# Patient Record
Sex: Female | Born: 1955
Health system: Southern US, Community
[De-identification: ages and names within clinical notes are randomized; demographics above are authoritative.]

## PROBLEM LIST (undated history)

## (undated) DIAGNOSIS — U071 COVID-19: Secondary | ICD-10-CM

## (undated) DIAGNOSIS — D649 Anemia, unspecified: Secondary | ICD-10-CM

## (undated) DIAGNOSIS — F32A Depression, unspecified: Secondary | ICD-10-CM

## (undated) DIAGNOSIS — F419 Anxiety disorder, unspecified: Secondary | ICD-10-CM

## (undated) DIAGNOSIS — K589 Irritable bowel syndrome without diarrhea: Secondary | ICD-10-CM

## (undated) DIAGNOSIS — K219 Gastro-esophageal reflux disease without esophagitis: Secondary | ICD-10-CM

## (undated) DIAGNOSIS — J449 Chronic obstructive pulmonary disease, unspecified: Secondary | ICD-10-CM

## (undated) DIAGNOSIS — F329 Major depressive disorder, single episode, unspecified: Secondary | ICD-10-CM

## (undated) DIAGNOSIS — J8410 Pulmonary fibrosis, unspecified: Secondary | ICD-10-CM

## (undated) DIAGNOSIS — J849 Interstitial pulmonary disease, unspecified: Secondary | ICD-10-CM

## (undated) HISTORY — DX: Gastro-esophageal reflux disease without esophagitis: K21.9

## (undated) HISTORY — PX: ABDOMINAL HYSTERECTOMY: SHX81

## (undated) HISTORY — DX: Depression, unspecified: F32.A

## (undated) HISTORY — DX: Anxiety disorder, unspecified: F41.9

## (undated) HISTORY — DX: Major depressive disorder, single episode, unspecified: F32.9

## (undated) HISTORY — DX: Interstitial pulmonary disease, unspecified: J84.9

## (undated) HISTORY — DX: Anemia, unspecified: D64.9

## (undated) HISTORY — DX: Irritable bowel syndrome, unspecified: K58.9

---

## 1979-05-29 HISTORY — PX: TUBAL LIGATION: SHX77

## 1981-05-28 HISTORY — PX: PARTIAL HYSTERECTOMY: SHX80

## 2005-06-29 ENCOUNTER — Emergency Department (HOSPITAL_COMMUNITY): Admission: EM | Admit: 2005-06-29 | Discharge: 2005-06-29 | Payer: Self-pay | Admitting: Emergency Medicine

## 2005-10-01 ENCOUNTER — Ambulatory Visit (HOSPITAL_BASED_OUTPATIENT_CLINIC_OR_DEPARTMENT_OTHER): Admission: RE | Admit: 2005-10-01 | Discharge: 2005-10-01 | Payer: Self-pay | Admitting: Orthopedic Surgery

## 2011-03-21 DIAGNOSIS — R079 Chest pain, unspecified: Secondary | ICD-10-CM

## 2016-01-10 ENCOUNTER — Other Ambulatory Visit: Payer: Self-pay | Admitting: Physician Assistant

## 2016-01-10 ENCOUNTER — Ambulatory Visit: Payer: Self-pay | Admitting: Physician Assistant

## 2016-01-10 ENCOUNTER — Encounter: Payer: Self-pay | Admitting: Physician Assistant

## 2016-01-10 VITALS — BP 104/60 | HR 99 | Temp 97.7°F | Ht 64.25 in | Wt 175.8 lb

## 2016-01-10 DIAGNOSIS — Z131 Encounter for screening for diabetes mellitus: Secondary | ICD-10-CM

## 2016-01-10 DIAGNOSIS — Z13220 Encounter for screening for lipoid disorders: Secondary | ICD-10-CM

## 2016-01-10 DIAGNOSIS — F419 Anxiety disorder, unspecified: Secondary | ICD-10-CM

## 2016-01-10 DIAGNOSIS — Z1239 Encounter for other screening for malignant neoplasm of breast: Secondary | ICD-10-CM

## 2016-01-10 DIAGNOSIS — W57XXXA Bitten or stung by nonvenomous insect and other nonvenomous arthropods, initial encounter: Secondary | ICD-10-CM

## 2016-01-10 LAB — GLUCOSE, POCT (MANUAL RESULT ENTRY): POC Glucose: 115 mg/dl — AB (ref 70–99)

## 2016-01-10 MED ORDER — SERTRALINE HCL 50 MG PO TABS
50.0000 mg | ORAL_TABLET | Freq: Every day | ORAL | 0 refills | Status: DC
Start: 2016-01-10 — End: 2016-01-31

## 2016-01-10 MED ORDER — ALBUTEROL SULFATE HFA 108 (90 BASE) MCG/ACT IN AERS: 2.0000 | INHALATION_SPRAY | Freq: Four times a day (QID) | RESPIRATORY_TRACT | 1 refills | Status: DC | PRN

## 2016-01-10 NOTE — Progress Notes (Signed)
BP 104/60 (BP Location: Right Arm, Patient Position: Sitting, Cuff Size: Normal)   Pulse 99   Temp 97.7 F (36.5 C)   Ht 5' 4.25" (1.632 m)   Wt 175 lb 12 oz (79.7 kg)   SpO2 90%   BMI 29.93 kg/m    Subjective:    Patient ID: Amber Lloyd, female    DOB: November 13, 1955, 60 y.o.   MRN: [id number]  HPI: Amber Lloyd is a 60 y.o. female presenting on 01/10/2016 for New Patient (Initial Visit); Skin Problem (pt states it is everywhere. pt states itch, burn when scratches. pt states she has been using antibiotic cream and helps a little. pt believes it may be "derma mites",); and Mental Health Problem (pt states she has been having anxiety attacks and sob. not necessarily at the same time.)   HPI Chief Complaint  Patient presents with  . New Patient (Initial Visit)  . Skin Problem    pt states it is everywhere. pt states itch, burn when scratches. pt states she has been using antibiotic cream and helps a little. pt believes it may be "derma mites",  . Mental Health Problem    pt states she has been having anxiety attacks and sob. not necessarily at the same time.   Pt last had PCP in [place] with Dr Loney Hering about 3 years ago.  Pt c/o R sided HA for the past month. It is not constant. usually 3 days / week. It lasts 24-48 hours.  She takes otc cold and sinus medicine and ibuprofen and they help but they don't resolve problem.    Pt has hx anxiety attacks.  She was on zoloft in the past and it worked well for her.    Pt states rash off and on for 6 years. She says it's more like bites. She says there are 5 people in her home and she is the only one with these bites. She shares bed with husband and he doesn't have them.  Her dog also sleeps with her.   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 Interim medical history since our last visit reviewed. Allergies and medications reviewed and updated.  Review of Systems  Constitutional: Negative for appetite change, chills, diaphoresis, fatigue, fever and unexpected weight change.  HENT: Positive for congestion. Negative for dental problem, drooling, ear pain, facial swelling, hearing loss, mouth sores, sneezing, sore throat, trouble swallowing and voice change.   Eyes: Negative for pain, discharge, redness, itching and visual disturbance.  Respiratory: Positive for cough and shortness of breath. Negative for choking and wheezing.   Cardiovascular: Negative for chest pain, palpitations and leg swelling.  Gastrointestinal: Positive for constipation. Negative for abdominal pain, blood in stool, diarrhea and vomiting.  Endocrine: Positive for cold intolerance, heat intolerance and polydipsia.  Genitourinary: Negative for decreased urine volume, dysuria and hematuria.  Musculoskeletal: Positive for arthralgias and back pain. Negative for gait problem.  Skin: Negative for rash.  Allergic/Immunologic: Positive for environmental allergies.  Neurological: Positive for headaches. Negative for seizures, syncope and light-headedness.  Hematological: Negative for adenopathy.  Psychiatric/Behavioral: Positive for agitation and dysphoric mood. Negative for suicidal ideas. The patient is nervous/anxious.     Per HPI unless specifically indicated above     Objective:    BP 104/60 (BP Location:  Right Arm, Patient Position: Sitting, Cuff Size: Normal)   Pulse 99   Temp 97.7 F (36.5 C)   Ht 5' 4.25" (1.632 m)   Wt 175 lb 12 oz (79.7 kg)   SpO2 90%   BMI 29.93 kg/m   Wt Readings from Last 3 Encounters:  01/10/16 175 lb 12 oz (79.7 kg)    GAD 7 score 19 PHQ 9 score 12  Physical Exam  Constitutional: She is oriented to person, place, and time. She appears well-developed and well-nourished.  HENT:  Head: Normocephalic and  atraumatic.  Mouth/Throat: Oropharynx is clear and moist. No oropharyngeal exudate.  Eyes: Conjunctivae and EOM are normal. Pupils are equal, round, and reactive to light.  Neck: Neck supple. No thyromegaly present.  Cardiovascular: Normal rate and regular rhythm.   Pulmonary/Chest: Effort normal and breath sounds normal.  Abdominal: Soft. Bowel sounds are normal. She exhibits no mass. There is no hepatosplenomegaly. There is no tenderness.  Musculoskeletal: She exhibits no edema.  Lymphadenopathy:    She has no cervical adenopathy.  Neurological: She is alert and oriented to person, place, and time. Gait normal.  Skin: Skin is warm and dry.  Multiple papules scattered on body consistent with bites.  Most with excoriations. No seconary infection seen  Psychiatric: She has a normal mood and affect. Her behavior is normal.  Vitals reviewed.   Results for orders placed or performed in visit on 01/10/16  POCT Glucose (CBG)  Result Value Ref Range   POC Glucose 115 (A) 70 - 99 mg/dl      Assessment & Plan:   Encounter Diagnoses  Name Primary?  Marland Kitchen Anxiety Yes  . Insect bite of multiple sites with local reaction   . Screening for diabetes mellitus (DM)   . Screening cholesterol level   . Screening for breast cancer     -pt to get Baseline labs -rx zoloft for anxiety -order albuterol mdi -sign pt up for [facility] to get inhaler.  When zoloft gets to correct dosage, can order this through [facility]ist as well -encouraged pt to contact [facility] for anxiety counseling -order screening mammogram -recommended otc hydrocortisone cream for itching/bites. Suspect anxiety and chronic scratching making issue much worse -f/u in 3 weeks.  RTO sooner prn

## 2016-01-10 NOTE — Patient Instructions (Signed)
Baseline labs zoloft Inhaler Call [facility] for counseling appointment We will schedule mammogram for you otc hydrocortisone cream for itchy spots

## 2016-01-17 LAB — CBC WITH DIFFERENTIAL/PLATELET
BASOS PCT: 0 %
Basophils Absolute: 0 cells/uL (ref 0–200)
EOS PCT: 9 %
Eosinophils Absolute: 414 cells/uL (ref 15–500)
HCT: 28.1 % — ABNORMAL LOW (ref 35.0–45.0)
Hemoglobin: 8.6 g/dL — ABNORMAL LOW (ref 11.7–15.5)
LYMPHS PCT: 25 %
Lymphs Abs: 1150 cells/uL (ref 850–3900)
MCH: 22.9 pg — ABNORMAL LOW (ref 27.0–33.0)
MCHC: 30.6 g/dL — ABNORMAL LOW (ref 32.0–36.0)
MCV: 74.9 fL — ABNORMAL LOW (ref 80.0–100.0)
MONO ABS: 414 {cells}/uL (ref 200–950)
MONOS PCT: 9 %
MPV: 9.4 fL (ref 7.5–12.5)
Neutro Abs: 2622 cells/uL (ref 1500–7800)
Neutrophils Relative %: 57 %
PLATELETS: 231 10*3/uL (ref 140–400)
RBC: 3.75 MIL/uL — AB (ref 3.80–5.10)
RDW: 14.8 % (ref 11.0–15.0)
WBC: 4.6 10*3/uL (ref 3.8–10.8)

## 2016-01-17 LAB — TSH: TSH: 1.86 m[IU]/L

## 2016-01-17 LAB — COMPREHENSIVE METABOLIC PANEL
ALK PHOS: 70 U/L (ref 33–130)
ALT: 9 U/L (ref 6–29)
AST: 13 U/L (ref 10–35)
Albumin: 3.8 g/dL (ref 3.6–5.1)
BUN: 12 mg/dL (ref 7–25)
CHLORIDE: 106 mmol/L (ref 98–110)
CO2: 25 mmol/L (ref 20–31)
CREATININE: 0.7 mg/dL (ref 0.50–0.99)
Calcium: 8.6 mg/dL (ref 8.6–10.4)
Glucose, Bld: 95 mg/dL (ref 65–99)
Potassium: 3.9 mmol/L (ref 3.5–5.3)
SODIUM: 140 mmol/L (ref 135–146)
TOTAL PROTEIN: 6.5 g/dL (ref 6.1–8.1)
Total Bilirubin: 0.6 mg/dL (ref 0.2–1.2)

## 2016-01-17 LAB — LIPID PANEL
Cholesterol: 169 mg/dL (ref 125–200)
HDL: 68 mg/dL (ref >=46)
LDL CALC: 87 mg/dL (ref <130)
Total CHOL/HDL Ratio: 2.5 Ratio (ref <=5.0)
Triglycerides: 70 mg/dL (ref <150)
VLDL: 14 mg/dL (ref <30)

## 2016-01-18 LAB — HEMOGLOBIN A1C
HEMOGLOBIN A1C: 5.4 % (ref <5.7)
Mean Plasma Glucose: 108 mg/dL

## 2016-01-26 ENCOUNTER — Other Ambulatory Visit: Payer: Self-pay | Admitting: Physician Assistant

## 2016-01-26 ENCOUNTER — Encounter (HOSPITAL_COMMUNITY): Payer: Self-pay

## 2016-01-26 ENCOUNTER — Ambulatory Visit (HOSPITAL_COMMUNITY)
Admission: RE | Admit: 2016-01-26 | Discharge: 2016-01-26 | Disposition: A | Discharge status: Home / Self Care | Payer: Self-pay | Source: Ambulatory Visit | Attending: Physician Assistant | Admitting: Physician Assistant

## 2016-01-26 ENCOUNTER — Ambulatory Visit (HOSPITAL_COMMUNITY): Payer: Self-pay

## 2016-01-26 ENCOUNTER — Ambulatory Visit (HOSPITAL_COMMUNITY): Admission: RE | Admit: 2016-01-26 | Payer: Self-pay | Source: Ambulatory Visit

## 2016-01-26 DIAGNOSIS — Z1231 Encounter for screening mammogram for malignant neoplasm of breast: Secondary | ICD-10-CM

## 2016-01-27 ENCOUNTER — Other Ambulatory Visit: Payer: Self-pay | Admitting: Physician Assistant

## 2016-01-27 DIAGNOSIS — R928 Other abnormal and inconclusive findings on diagnostic imaging of breast: Secondary | ICD-10-CM

## 2016-01-31 ENCOUNTER — Encounter: Payer: Self-pay | Admitting: Physician Assistant

## 2016-01-31 ENCOUNTER — Ambulatory Visit: Payer: Self-pay | Admitting: Physician Assistant

## 2016-01-31 ENCOUNTER — Other Ambulatory Visit: Payer: Self-pay | Admitting: Physician Assistant

## 2016-01-31 VITALS — BP 100/60 | HR 86 | Temp 97.5°F | Resp 16 | Ht 64.25 in | Wt 175.0 lb

## 2016-01-31 DIAGNOSIS — F419 Anxiety disorder, unspecified: Secondary | ICD-10-CM

## 2016-01-31 DIAGNOSIS — Z1211 Encounter for screening for malignant neoplasm of colon: Secondary | ICD-10-CM

## 2016-01-31 DIAGNOSIS — D649 Anemia, unspecified: Secondary | ICD-10-CM

## 2016-01-31 MED ORDER — SERTRALINE HCL 50 MG PO TABS: 50.0000 mg | ORAL_TABLET | Freq: Every day | ORAL | 3 refills | Status: DC

## 2016-01-31 NOTE — Patient Instructions (Signed)
Return stool test Start iron daily Continue zoloft Recheck blood in one month

## 2016-01-31 NOTE — Progress Notes (Signed)
 BP 100/60   Pulse 86   Temp 97.5 F (36.4 C)   Resp 16   Ht 5' 4.25" (1.632 m)   Wt 175 lb (79.4 kg)   SpO2 97%   BMI 29.81 kg/m    Subjective:    Patient ID: Amber Lloyd, female    DOB: 04/21/1956, 60 y.o.   MRN: [id number]  HPI: Amber Lloyd is a 60 y.o. female presenting on 01/31/2016 for Mental Health Problem and Follow-up (labs)   HPI   Pt did not call [facility]. she says she didn't feel it was necessary.  She says the zoloft is helping a lot.    Reviewed labs with pt.  She states normal colonoscopy when she was 49 at [facility].  States hx anemia.   Relevant past medical, surgical, family and social history reviewed and updated as indicated. Interim medical history since our last visit reviewed. Allergies and medications reviewed and updated.   Current Outpatient Prescriptions:  .  Ibuprofen (ADVIL PO), Take 1 tablet by mouth daily. For shoulder pain and headaches, Disp: , Rfl:  .  loratadine (CLARITIN) 10 MG tablet, Take 10 mg by mouth daily., Disp: , Rfl:  .  naproxen sodium (ANAPROX) 220 MG tablet, Take 220 mg by mouth daily as needed., Disp: , Rfl:  .  OMEPRAZOLE PO, Take 1 tablet by mouth daily., Disp: , Rfl:  .  sertraline (ZOLOFT) 50 MG tablet, Take 1 tablet (50 mg total) by mouth daily., Disp: 30 tablet, Rfl: 0 .  albuterol (PROVENTIL HFA;VENTOLIN HFA) 108 (90 Base) MCG/ACT inhaler, Inhale 2 puffs into the lungs every 6 (six) hours as needed for wheezing or shortness of breath. (Patient not taking: Reported on 01/31/2016), Disp: 3 Inhaler, Rfl: 1   Review of Systems  Constitutional: Negative for appetite change, chills, diaphoresis, fatigue, fever and unexpected weight change.  HENT: Positive for congestion. Negative for drooling, ear pain, facial swelling, hearing loss, mouth sores, sneezing, sore throat, trouble swallowing and voice change.   Eyes: Negative for pain, discharge, redness, itching and visual disturbance.  Respiratory: Positive for shortness of breath.  Negative for cough, choking and wheezing.   Cardiovascular: Negative for chest pain, palpitations and leg swelling.  Gastrointestinal: Negative for abdominal pain, blood in stool, constipation, diarrhea and vomiting.  Endocrine: Negative for cold intolerance, heat intolerance and polydipsia.  Genitourinary: Negative for decreased urine volume, dysuria and hematuria.  Musculoskeletal: Negative for arthralgias, back pain and gait problem.  Skin: Negative for rash.  Allergic/Immunologic: Positive for environmental allergies.  Neurological: Positive for headaches. Negative for seizures, syncope and light-headedness.  Hematological: Negative for adenopathy.  Psychiatric/Behavioral: Positive for agitation. Negative for dysphoric mood and suicidal ideas. The patient is nervous/anxious.     Per HPI unless specifically indicated above     Objective:    BP 100/60   Pulse 86   Temp 97.5 F (36.4 C)   Resp 16   Ht 5' 4.25" (1.632 m)   Wt 175 lb (79.4 kg)   SpO2 97%   BMI 29.81 kg/m   Wt Readings from Last 3 Encounters:  01/31/16 175 lb (79.4 kg)  01/10/16 175 lb 12 oz (79.7 kg)    Physical Exam  Constitutional: She is oriented to person, place, and time. She appears well-developed and well-nourished.  HENT:  Head: Normocephalic and atraumatic.  Neck: Neck supple.  Cardiovascular: Normal rate and regular rhythm.   Pulmonary/Chest: Effort normal and breath sounds normal.  Abdominal: Soft. Bowel sounds are normal. She   exhibits no mass. There is no hepatosplenomegaly. There is no tenderness.  Musculoskeletal: She exhibits no edema.  Lymphadenopathy:    She has no cervical adenopathy.  Neurological: She is alert and oriented to person, place, and time.  Skin: Skin is warm and dry.  Skin that was previously severely picked at last appointment is much improved- no obvious excoriations seen  Psychiatric: She has a normal mood and affect. Her behavior is normal.  Vitals reviewed.      Results for orders placed or performed in visit on 01/10/16  TSH  Result Value Ref Range   TSH 1.86 mIU/L  CBC w/Diff/Platelet  Result Value Ref Range   WBC 4.6 3.8 - 10.8 K/uL   RBC 3.75 (L) 3.80 - 5.10 MIL/uL   Hemoglobin 8.6 (L) 11.7 - 15.5 g/dL   HCT 28.1 (L) 35.0 - 45.0 %   MCV 74.9 (L) 80.0 - 100.0 fL   MCH 22.9 (L) 27.0 - 33.0 pg   MCHC 30.6 (L) 32.0 - 36.0 g/dL   RDW 14.8 11.0 - 15.0 %   Platelets 231 140 - 400 K/uL   MPV 9.4 7.5 - 12.5 fL   Neutro Abs 2,622 1,500 - 7,800 cells/uL   Lymphs Abs 1,150 850 - 3,900 cells/uL   Monocytes Absolute 414 200 - 950 cells/uL   Eosinophils Absolute 414 15 - 500 cells/uL   Basophils Absolute 0 0 - 200 cells/uL   Neutrophils Relative % 57 %   Lymphocytes Relative 25 %   Monocytes Relative 9 %   Eosinophils Relative 9 %   Basophils Relative 0 %   Smear Review Criteria for review not met   Comprehensive metabolic panel  Result Value Ref Range   Sodium 140 135 - 146 mmol/L   Potassium 3.9 3.5 - 5.3 mmol/L   Chloride 106 98 - 110 mmol/L   CO2 25 20 - 31 mmol/L   Glucose, Bld 95 65 - 99 mg/dL   BUN 12 7 - 25 mg/dL   Creat 0.70 0.50 - 0.99 mg/dL   Total Bilirubin 0.6 0.2 - 1.2 mg/dL   Alkaline Phosphatase 70 33 - 130 U/L   AST 13 10 - 35 U/L   ALT 9 6 - 29 U/L   Total Protein 6.5 6.1 - 8.1 g/dL   Albumin 3.8 3.6 - 5.1 g/dL   Calcium 8.6 8.6 - 10.4 mg/dL  Hemoglobin A1C  Result Value Ref Range   Hgb A1c MFr Bld 5.4 <5.7 %   Mean Plasma Glucose 108 mg/dL  Lipid Profile  Result Value Ref Range   Cholesterol 169 125 - 200 mg/dL   Triglycerides 70 <150 mg/dL   HDL 68 >=46 mg/dL   Total CHOL/HDL Ratio 2.5 <=5.0 Ratio   VLDL 14 <30 mg/dL   LDL Cholesterol 87 <130 mg/dL  POCT Glucose (CBG)  Result Value Ref Range   POC Glucose 115 (A) 70 - 99 mg/dl      Assessment & Plan:   Encounter Diagnoses  Name Primary?  Marland Kitchen Anemia, unspecified anemia type Yes  . Anxiety   . Special screening for malignant neoplasms, colon       -iFOBT given for colon cancer screening -pt to start otc iron for anemia -pt to Continue zoloft for mood -discussed needed f/u for mammogram -Recheck 1 month

## 2016-02-06 LAB — IFOBT (OCCULT BLOOD): IFOBT: NEGATIVE

## 2016-02-14 ENCOUNTER — Other Ambulatory Visit (HOSPITAL_COMMUNITY): Payer: Self-pay | Admitting: *Deleted

## 2016-02-14 DIAGNOSIS — R928 Other abnormal and inconclusive findings on diagnostic imaging of breast: Secondary | ICD-10-CM

## 2016-02-20 ENCOUNTER — Other Ambulatory Visit: Payer: Self-pay

## 2016-02-20 DIAGNOSIS — D649 Anemia, unspecified: Secondary | ICD-10-CM

## 2016-02-21 ENCOUNTER — Ambulatory Visit (HOSPITAL_COMMUNITY)
Admission: RE | Admit: 2016-02-21 | Discharge: 2016-02-21 | Disposition: A | Discharge status: Home / Self Care | Payer: PRIVATE HEALTH INSURANCE | Source: Ambulatory Visit | Attending: *Deleted | Admitting: *Deleted

## 2016-02-21 DIAGNOSIS — N63 Unspecified lump in breast: Secondary | ICD-10-CM | POA: Insufficient documentation

## 2016-02-21 DIAGNOSIS — R928 Other abnormal and inconclusive findings on diagnostic imaging of breast: Secondary | ICD-10-CM | POA: Diagnosis present

## 2016-02-21 LAB — HEMOGLOBIN: Hemoglobin: 10.1 g/dL — ABNORMAL LOW (ref 11.7–15.5)

## 2016-02-21 LAB — HEMATOCRIT: HEMATOCRIT: 33.5 % — AB (ref 35.0–45.0)

## 2016-02-27 ENCOUNTER — Ambulatory Visit: Payer: Self-pay

## 2016-02-28 ENCOUNTER — Encounter: Payer: Self-pay | Admitting: Physician Assistant

## 2016-02-28 ENCOUNTER — Ambulatory Visit: Payer: Self-pay | Admitting: Physician Assistant

## 2016-02-28 VITALS — BP 104/62 | HR 84 | Temp 97.9°F | Ht 64.25 in | Wt 171.2 lb

## 2016-02-28 DIAGNOSIS — J449 Chronic obstructive pulmonary disease, unspecified: Secondary | ICD-10-CM

## 2016-02-28 DIAGNOSIS — F419 Anxiety disorder, unspecified: Secondary | ICD-10-CM

## 2016-02-28 DIAGNOSIS — D649 Anemia, unspecified: Secondary | ICD-10-CM

## 2016-02-28 DIAGNOSIS — J439 Emphysema, unspecified: Secondary | ICD-10-CM | POA: Insufficient documentation

## 2016-02-28 NOTE — Progress Notes (Signed)
BP 104/62 (BP Location: Left Arm, Patient Position: Sitting, Cuff Size: Normal)   Pulse 84   Temp 97.9 F (36.6 C)   Ht 5' 4.25" (1.632 m)   Wt 171 lb 3.2 oz (77.7 kg)   SpO2 93%   BMI 29.16 kg/m    Subjective:    Patient ID: Amber Lloyd, female    DOB: [birth year], 59 y.o.   MRN: [id number]  HPI: Amber Lloyd is a 60 y.o. female presenting on 02/28/2016 for Anemia and Anxiety   HPI   Pt states she is feeling much better on the zoloft  Pt didn't get inhaler because she didn't bring in papers needed for [facility]  Relevant past medical, surgical, family and social history reviewed and updated as indicated. Interim medical history since our last visit reviewed. Allergies and medications reviewed and updated.   Current Outpatient Prescriptions:  .  Ibuprofen (ADVIL PO), Take 1 tablet by mouth daily as needed. For shoulder pain and headaches , Disp: , Rfl:  .  IRON PO, Take 1 tablet by mouth daily., Disp: , Rfl:  .  loratadine (CLARITIN) 10 MG tablet, Take 10 mg by mouth daily as needed. , Disp: , Rfl:  .  naproxen sodium (ANAPROX) 220 MG tablet, Take 220 mg by mouth daily as needed., Disp: , Rfl:  .  OMEPRAZOLE PO, Take 1 tablet by mouth daily., Disp: , Rfl:  .  sertraline (ZOLOFT) 50 MG tablet, Take 1 tablet (50 mg total) by mouth daily., Disp: 30 tablet, Rfl: 3 .  albuterol (PROVENTIL HFA;VENTOLIN HFA) 108 (90 Base) MCG/ACT inhaler, Inhale 2 puffs into the lungs every 6 (six) hours as needed for wheezing or shortness of breath. (Patient not taking: Reported on 02/28/2016), Disp: 3 Inhaler, Rfl: 1   Review of Systems  Constitutional: Positive for fatigue. Negative for appetite change, chills, diaphoresis, fever and unexpected weight change.  HENT: Negative for congestion, dental problem, drooling, ear pain, facial swelling, hearing loss, mouth sores, sneezing, sore throat, trouble swallowing and voice change.   Eyes: Negative for pain, discharge, redness, itching and  visual disturbance.  Respiratory: Positive for shortness of breath. Negative for cough, choking and wheezing.   Cardiovascular: Negative for chest pain, palpitations and leg swelling.  Gastrointestinal: Negative for abdominal pain, blood in stool, constipation, diarrhea and vomiting.  Endocrine: Negative for cold intolerance, heat intolerance and polydipsia.  Genitourinary: Negative for decreased urine volume, dysuria and hematuria.  Musculoskeletal: Positive for arthralgias and back pain. Negative for gait problem.  Skin: Negative for rash.  Allergic/Immunologic: Negative for environmental allergies.  Neurological: Negative for seizures, syncope, light-headedness and headaches.  Hematological: Negative for adenopathy.  Psychiatric/Behavioral: Negative for agitation, dysphoric mood and suicidal ideas. The patient is nervous/anxious.     Per HPI unless specifically indicated above     Objective:    BP 104/62 (BP Location: Left Arm, Patient Position: Sitting, Cuff Size: Normal)   Pulse 84   Temp 97.9 F (36.6 C)   Ht 5' 4.25" (1.632 m)   Wt 171 lb 3.2 oz (77.7 kg)   SpO2 93%   BMI 29.16 kg/m   Wt Readings from Last 3 Encounters:  02/28/16 171 lb 3.2 oz (77.7 kg)  01/31/16 175 lb (79.4 kg)  01/10/16 175 lb 12 oz (79.7 kg)    Physical Exam  Constitutional: She is oriented to person, place, and time. She appears well-developed and well-nourished.  HENT:  Head: Normocephalic and atraumatic.  Neck: Neck supple.  Cardiovascular: Normal rate  and regular rhythm.   Pulmonary/Chest: Effort normal. No tachypnea. No respiratory distress. She has wheezes (occassional soft expiratory wheeze). She has no rales.  Abdominal: Soft. Bowel sounds are normal. She exhibits no mass. There is no hepatosplenomegaly. There is no tenderness.  Musculoskeletal: She exhibits no edema.  Lymphadenopathy:    She has no cervical adenopathy.  Neurological: She is alert and oriented to person, place, and time.   Skin: Skin is warm and dry.  Psychiatric: She has a normal mood and affect. Her behavior is normal.  Vitals reviewed.   Results for orders placed or performed in visit on 02/20/16  Hemoglobin  Result Value Ref Range   Hemoglobin 10.1 (L) 11.7 - 15.5 g/dL  Hematocrit  Result Value Ref Range   HCT 33.5 (L) 35.0 - 45.0 %      Assessment & Plan:   Encounter Diagnoses  Name Primary?  Marland Kitchen Anxiety Yes  . Anemia, unspecified type   . Chronic obstructive pulmonary disease, unspecified COPD type (HCC)      -reviewed labs with pt  -pt to continue zoloft for mood which is much improved -pt to continue iron for anemia which is much improved -pt brought in  Papers for [facility]ist today so she should get her inhaler in about 2 weeks -folllow-up 2 months.  RTO sooner prn

## 2016-03-27 NOTE — Addendum Note (Signed)
Encounter addended by: Novella Olive on: 03/27/2016  1:28 PM<BR>    Actions taken: Imaging Exam ended

## 2016-03-28 ENCOUNTER — Encounter: Payer: Self-pay | Admitting: Physician Assistant

## 2016-04-25 ENCOUNTER — Other Ambulatory Visit: Payer: Self-pay

## 2016-04-25 DIAGNOSIS — D649 Anemia, unspecified: Secondary | ICD-10-CM

## 2016-04-27 LAB — HEMATOCRIT: HEMATOCRIT: 36.7 % (ref 35.0–45.0)

## 2016-04-27 LAB — HEMOGLOBIN: HEMOGLOBIN: 11.3 g/dL — AB (ref 11.7–15.5)

## 2016-04-30 ENCOUNTER — Encounter: Payer: Self-pay | Admitting: Physician Assistant

## 2016-04-30 ENCOUNTER — Ambulatory Visit: Payer: Self-pay | Admitting: Physician Assistant

## 2016-04-30 VITALS — BP 108/66 | HR 86 | Temp 97.9°F | Ht 64.25 in | Wt 171.0 lb

## 2016-04-30 DIAGNOSIS — J449 Chronic obstructive pulmonary disease, unspecified: Secondary | ICD-10-CM

## 2016-04-30 DIAGNOSIS — F419 Anxiety disorder, unspecified: Secondary | ICD-10-CM

## 2016-04-30 DIAGNOSIS — D649 Anemia, unspecified: Secondary | ICD-10-CM

## 2016-04-30 MED ORDER — SERTRALINE HCL 100 MG PO TABS: 100.0000 mg | ORAL_TABLET | Freq: Every day | ORAL | 0 refills | Status: DC

## 2016-04-30 MED ORDER — SERTRALINE HCL 100 MG PO TABS: 100.0000 mg | ORAL_TABLET | Freq: Every day | ORAL | 1 refills | Status: DC

## 2016-04-30 NOTE — Progress Notes (Signed)
BP 108/66 (BP Location: Left Arm, Patient Position: Sitting, Cuff Size: Normal)   Pulse 86   Temp 97.9 F (36.6 C)   Ht 5' 4.25" (1.632 m)   Wt 171 lb (77.6 kg)   SpO2 92%   BMI 29.12 kg/m    Subjective:    Patient ID: Amber Lloyd, female    DOB: [birth year], 60 y.o.   MRN: [id number]  HPI: [name] is a 60 y.o. female presenting on 04/30/2016 for Mental Health Problem and Anemia   HPI   Pt says taht the zoloft is working but she thinks it probably needs to be increased.  Says she is picking more.   She thinks it's mostly related to caring for 2 teenagers and she says her husband is difficult also.  Relevant past medical, surgical, family and social history reviewed and updated as indicated. Interim medical history since our last visit reviewed. Allergies and medications reviewed and updated.   Current Outpatient Prescriptions:  .  albuterol (PROVENTIL HFA;VENTOLIN HFA) 108 (90 Base) MCG/ACT inhaler, Inhale 2 puffs into the lungs every 6 (six) hours as needed for wheezing or shortness of breath., Disp: 3 Inhaler, Rfl: 1 .  Ibuprofen (ADVIL PO), Take 1 tablet by mouth daily as needed. For shoulder pain and headaches , Disp: , Rfl:  .  IRON PO, Take 1 tablet by mouth daily., Disp: , Rfl:  .  loratadine (CLARITIN) 10 MG tablet, Take 10 mg by mouth daily as needed. , Disp: , Rfl:  .  naproxen sodium (ANAPROX) 220 MG tablet, Take 220 mg by mouth daily as needed., Disp: , Rfl:  .  sertraline (ZOLOFT) 50 MG tablet, Take 1 tablet (50 mg total) by mouth daily., Disp: 30 tablet, Rfl: 3 .  OMEPRAZOLE PO, Take 1 tablet by mouth daily., Disp: , Rfl:    Review of Systems  Constitutional: Negative for appetite change, chills, diaphoresis, fatigue, fever and unexpected weight change.  HENT: Negative for congestion, dental problem, drooling, ear pain, facial swelling, hearing loss, mouth sores, sneezing, sore throat, trouble swallowing and voice change.   Eyes: Negative for pain,  discharge, redness, itching and visual disturbance.  Respiratory: Positive for shortness of breath. Negative for cough, choking and wheezing.   Cardiovascular: Negative for chest pain, palpitations and leg swelling.  Gastrointestinal: Negative for abdominal pain, blood in stool, constipation, diarrhea and vomiting.  Endocrine: Negative for cold intolerance, heat intolerance and polydipsia.  Genitourinary: Negative for decreased urine volume, dysuria and hematuria.  Musculoskeletal: Positive for arthralgias. Negative for back pain and gait problem.  Skin: Negative for rash.  Allergic/Immunologic: Negative for environmental allergies.  Neurological: Negative for seizures, syncope, light-headedness and headaches.  Hematological: Negative for adenopathy.  Psychiatric/Behavioral: Negative for agitation, dysphoric mood and suicidal ideas. The patient is nervous/anxious.     Per HPI unless specifically indicated above     Objective:    BP 108/66 (BP Location: Left Arm, Patient Position: Sitting, Cuff Size: Normal)   Pulse 86   Temp 97.9 F (36.6 C)   Ht 5' 4.25" (1.632 m)   Wt 171 lb (77.6 kg)   SpO2 92%   BMI 29.12 kg/m   Wt Readings from Last 3 Encounters:  04/30/16 171 lb (77.6 kg)  02/28/16 171 lb 3.2 oz (77.7 kg)  01/31/16 175 lb (79.4 kg)    phq-9 score 1 Gad-7 score 2  Physical Exam  Constitutional: She is oriented to person, place, and time. She appears well-developed and well-nourished.  HENT:  Head: Normocephalic and atraumatic.  Neck: Neck supple.  Cardiovascular: Normal rate and regular rhythm.   Pulmonary/Chest: Effort normal and breath sounds normal.  Abdominal: Soft. Bowel sounds are normal. She exhibits no mass. There is no hepatosplenomegaly. There is no tenderness.  Musculoskeletal: She exhibits no edema.  Lymphadenopathy:    She has no cervical adenopathy.  Neurological: She is alert and oriented to person, place, and time.  Skin: Skin is warm and dry.   Psychiatric: She has a normal mood and affect. Her behavior is normal.  Vitals reviewed.   Results for orders placed or performed in visit on 04/25/16  Hematocrit  Result Value Ref Range   HCT 36.7 35.0 - 45.0 %  Hemoglobin  Result Value Ref Range   Hemoglobin 11.3 (L) 11.7 - 15.5 g/dL      Assessment & Plan:   Encounter Diagnoses  Name Primary?  Marland Kitchen Anxiety Yes  . Anemia, unspecified type   . Chronic obstructive pulmonary disease, unspecified COPD type (HCC)      -Reviewed labs with pt  -recommended [facility] Call [facility]/counseling -increase zoloft to 100mg  daily -pt to continue iron -discussed with pt her reported carrier state of alpha-1 antitrypsin deficiency.  Discussed that her greater than 30 year history of smoking likely plays a stronger role in her sob now.   -F/u 6 week to check mood.  RTO sooner prn  (The duration of this appointment visit was 25 minutes of face-to-face time with the patient.  Greater than 50% of this time was spent in counseling, explanation of diagnosis, planning of further management, and coordination of care.)

## 2016-06-12 ENCOUNTER — Encounter: Payer: Self-pay | Admitting: Physician Assistant

## 2016-06-12 ENCOUNTER — Ambulatory Visit: Payer: Self-pay | Admitting: Physician Assistant

## 2016-06-12 VITALS — BP 110/60 | HR 88 | Temp 97.7°F | Wt 170.8 lb

## 2016-06-12 DIAGNOSIS — J449 Chronic obstructive pulmonary disease, unspecified: Secondary | ICD-10-CM

## 2016-06-12 DIAGNOSIS — F419 Anxiety disorder, unspecified: Secondary | ICD-10-CM

## 2016-06-12 DIAGNOSIS — D649 Anemia, unspecified: Secondary | ICD-10-CM

## 2016-06-12 LAB — HEMATOCRIT: HCT: 40.9 % (ref 35.0–45.0)

## 2016-06-12 LAB — HEMOGLOBIN: Hemoglobin: 13.2 g/dL (ref 11.7–15.5)

## 2016-06-12 MED ORDER — ALBUTEROL SULFATE HFA 108 (90 BASE) MCG/ACT IN AERS: 2.0000 | INHALATION_SPRAY | Freq: Four times a day (QID) | RESPIRATORY_TRACT | 1 refills | Status: DC | PRN

## 2016-06-12 NOTE — Progress Notes (Signed)
BP 110/60 (BP Location: Left Arm, Patient Position: Sitting, Cuff Size: Normal)   Pulse 88   Temp 97.7 F (36.5 C)   Wt 170 lb 12 oz (77.5 kg)   SpO2 92%   BMI 29.08 kg/m    Subjective:    Patient ID: Amber Lloyd, female    DOB: [birth year], 61 y.o.   MRN: [id number]  HPI: Amber Lloyd is a 61 y.o. female presenting on 06/12/2016 for Mental Health Problem   HPI   Pt has been to [facility] and has another appt this thrusday.  She is liking the zoloft and says it helps a lot.   Relevant past medical, surgical, family and social history reviewed and updated as indicated. Interim medical history since our last visit reviewed. Allergies and medications reviewed and updated.   Current Outpatient Prescriptions:  .  albuterol (PROVENTIL HFA;VENTOLIN HFA) 108 (90 Base) MCG/ACT inhaler, Inhale 2 puffs into the lungs every 6 (six) hours as needed for wheezing or shortness of breath., Disp: 3 Inhaler, Rfl: 1 .  Ibuprofen (ADVIL PO), Take 1 tablet by mouth daily as needed. For shoulder pain and headaches , Disp: , Rfl:  .  IRON PO, Take 1 tablet by mouth daily., Disp: , Rfl:  .  loratadine (CLARITIN) 10 MG tablet, Take 10 mg by mouth daily as needed. , Disp: , Rfl:  .  naproxen sodium (ANAPROX) 220 MG tablet, Take 220 mg by mouth daily as needed., Disp: , Rfl:  .  OMEPRAZOLE PO, Take 1 tablet by mouth daily., Disp: , Rfl:  .  sertraline (ZOLOFT) 100 MG tablet, Take 1 tablet (100 mg total) by mouth daily., Disp: 90 tablet, Rfl: 1   Review of Systems  Constitutional: Negative for appetite change, chills, diaphoresis, fatigue, fever and unexpected weight change.  HENT: Negative for congestion, dental problem, drooling, ear pain, facial swelling, hearing loss, mouth sores, sneezing, sore throat, trouble swallowing and voice change.   Eyes: Negative for pain, discharge, redness, itching and visual disturbance.  Respiratory: Positive for shortness of breath. Negative for cough, choking  and wheezing.   Cardiovascular: Negative for chest pain, palpitations and leg swelling.  Gastrointestinal: Negative for abdominal pain, blood in stool, constipation, diarrhea and vomiting.  Endocrine: Negative for cold intolerance, heat intolerance and polydipsia.  Genitourinary: Negative for decreased urine volume, dysuria and hematuria.  Musculoskeletal: Positive for arthralgias. Negative for back pain and gait problem.  Skin: Negative for rash.  Allergic/Immunologic: Negative for environmental allergies.  Neurological: Negative for seizures, syncope, light-headedness and headaches.  Hematological: Negative for adenopathy.  Psychiatric/Behavioral: Negative for agitation, dysphoric mood and suicidal ideas. The patient is not nervous/anxious.     Per HPI unless specifically indicated above     Objective:    BP 110/60 (BP Location: Left Arm, Patient Position: Sitting, Cuff Size: Normal)   Pulse 88   Temp 97.7 F (36.5 C)   Wt 170 lb 12 oz (77.5 kg)   SpO2 92%   BMI 29.08 kg/m   Wt Readings from Last 3 Encounters:  06/12/16 170 lb 12 oz (77.5 kg)  04/30/16 171 lb (77.6 kg)  02/28/16 171 lb 3.2 oz (77.7 kg)    Physical Exam  Constitutional: She is oriented to person, place, and time. She appears well-developed and well-nourished.  HENT:  Head: Normocephalic and atraumatic.  Neck: Neck supple.  Cardiovascular: Normal rate and regular rhythm.   Pulmonary/Chest: Effort normal and breath sounds normal.  Abdominal: Soft. Bowel sounds are normal. She  exhibits no mass. There is no hepatosplenomegaly. There is no tenderness.  Musculoskeletal: She exhibits no edema.  Lymphadenopathy:    She has no cervical adenopathy.  Neurological: She is alert and oriented to person, place, and time.  Skin: Skin is warm and dry.  Psychiatric: She has a normal mood and affect. Her behavior is normal.  Vitals reviewed.   Results for orders placed or performed in visit on 04/25/16  Hematocrit   Result Value Ref Range   HCT 36.7 35.0 - 45.0 %  Hemoglobin  Result Value Ref Range   Hemoglobin 11.3 (L) 11.7 - 15.5 g/dL      Assessment & Plan:   Encounter Diagnoses  Name Primary?  Marland Kitchen Anxiety Yes  . Anemia, unspecified type   . Chronic obstructive pulmonary disease, unspecified COPD type (HCC)     -Check h/h today -continue zoloft and continue with [facility] -F/u 3 months.  RTO sooner prn

## 2016-08-09 ENCOUNTER — Other Ambulatory Visit (HOSPITAL_COMMUNITY): Payer: Self-pay | Admitting: *Deleted

## 2016-08-09 DIAGNOSIS — IMO0002 Reserved for concepts with insufficient information to code with codable children: Secondary | ICD-10-CM

## 2016-08-09 DIAGNOSIS — R229 Localized swelling, mass and lump, unspecified: Principal | ICD-10-CM

## 2016-08-16 ENCOUNTER — Other Ambulatory Visit (HOSPITAL_COMMUNITY): Payer: Self-pay | Admitting: *Deleted

## 2016-08-16 DIAGNOSIS — N6320 Unspecified lump in the left breast, unspecified quadrant: Secondary | ICD-10-CM

## 2016-08-21 ENCOUNTER — Ambulatory Visit (HOSPITAL_COMMUNITY)
Admission: RE | Admit: 2016-08-21 | Discharge: 2016-08-21 | Disposition: A | Discharge status: Home / Self Care | Payer: PRIVATE HEALTH INSURANCE | Source: Ambulatory Visit | Attending: *Deleted | Admitting: *Deleted

## 2016-08-21 ENCOUNTER — Encounter (HOSPITAL_COMMUNITY): Payer: PRIVATE HEALTH INSURANCE

## 2016-08-21 ENCOUNTER — Encounter (HOSPITAL_COMMUNITY): Payer: Self-pay

## 2016-08-21 DIAGNOSIS — N6320 Unspecified lump in the left breast, unspecified quadrant: Secondary | ICD-10-CM | POA: Diagnosis not present

## 2016-08-28 ENCOUNTER — Encounter (HOSPITAL_COMMUNITY): Payer: PRIVATE HEALTH INSURANCE

## 2016-08-30 ENCOUNTER — Telehealth: Payer: Self-pay | Admitting: Student

## 2016-08-30 NOTE — Telephone Encounter (Signed)
Pt called c/o diarrhea for about a week and a half. Pt states mostly all BM have been liquidy, some slightly formed. Pt states having taken Imodium.   LPN explained to patient that PA was off for this week. Pt was recommended to seek  Medical care at an urgent care or the ER. Pt was also offered an appointment for some day next week. Pt stated she will hope she got better before next week and refused to make appointment. Pt again encouraged to seek medical care at an urgent care or ER.

## 2016-09-11 ENCOUNTER — Ambulatory Visit: Payer: Self-pay | Admitting: Physician Assistant

## 2016-09-11 ENCOUNTER — Encounter: Payer: Self-pay | Admitting: Physician Assistant

## 2016-09-11 VITALS — BP 110/70 | HR 94 | Temp 97.2°F | Ht 64.25 in | Wt 170.5 lb

## 2016-09-11 DIAGNOSIS — D649 Anemia, unspecified: Secondary | ICD-10-CM

## 2016-09-11 DIAGNOSIS — J449 Chronic obstructive pulmonary disease, unspecified: Secondary | ICD-10-CM

## 2016-09-11 DIAGNOSIS — J301 Allergic rhinitis due to pollen: Secondary | ICD-10-CM

## 2016-09-11 DIAGNOSIS — F419 Anxiety disorder, unspecified: Secondary | ICD-10-CM

## 2016-09-11 DIAGNOSIS — H6983 Other specified disorders of Eustachian tube, bilateral: Secondary | ICD-10-CM

## 2016-09-11 LAB — HEMOGLOBIN: Hemoglobin: 14.7 g/dL (ref 11.7–15.5)

## 2016-09-11 LAB — HEMATOCRIT: HCT: 43.2 % (ref 35.0–45.0)

## 2016-09-11 NOTE — Progress Notes (Signed)
BP 110/70 (BP Location: Left Arm, Patient Position: Sitting, Cuff Size: Normal)   Pulse 94   Temp 97.2 F (36.2 C) (Other (Comment))   Ht 5' 4.25" (1.632 m)   Wt 170 lb 8 oz (77.3 kg)   BMI 29.04 kg/m    Subjective:    Patient ID: Amber Lloyd, female    DOB: 1956-04-15, 61 y.o.   MRN: [id number]  HPI: Amber Lloyd is a 61 y.o. female presenting on 09/11/2016 for Anemia (went to Urgent care 08-30-16 r/t diarrhea and dehydration) and Mental Health Problem   HPI   [facility] is still [facility].  Her diarrhea and dehydration issues have resolved   Pt c/o air rushing into ears and pulsating sensation. This is Bilaterally off and on for about 6 weeks.  They are "achey" when pulsating but not "unbearable".  No ringing.    She states a little bit of cough and sneeze.  She says she does have some seasonal allergies which have been flared up lately.   Relevant past medical, surgical, family and social history reviewed and updated as indicated. Interim medical history since our last visit reviewed. Allergies and medications reviewed and updated.   Current Outpatient Prescriptions:  .  albuterol (PROVENTIL HFA;VENTOLIN HFA) 108 (90 Base) MCG/ACT inhaler, Inhale 2 puffs into the lungs every 6 (six) hours as needed for wheezing or shortness of breath., Disp: 3 Inhaler, Rfl: 1 .  Ibuprofen (ADVIL PO), Take 1 tablet by mouth daily as needed. For shoulder pain and headaches , Disp: , Rfl:  .  IRON PO, Take 1 tablet by mouth daily., Disp: , Rfl:  .  loratadine (CLARITIN) 10 MG tablet, Take 10 mg by mouth daily as needed. , Disp: , Rfl:  .  naproxen sodium (ANAPROX) 220 MG tablet, Take 220 mg by mouth daily as needed., Disp: , Rfl:  .  OMEPRAZOLE PO, Take 1 tablet by mouth daily., Disp: , Rfl:  .  sertraline (ZOLOFT) 100 MG tablet, Take 1 tablet (100 mg total) by mouth daily., Disp: 90 tablet, Rfl: 1   Review of Systems  Constitutional: Negative for appetite change, chills, diaphoresis,  fatigue, fever and unexpected weight change.  HENT: Positive for sneezing. Negative for congestion, dental problem, drooling, ear pain, facial swelling, hearing loss, mouth sores, sore throat, trouble swallowing and voice change.   Eyes: Negative for pain, discharge, redness, itching and visual disturbance.  Respiratory: Positive for cough and shortness of breath. Negative for choking and wheezing.   Cardiovascular: Negative for chest pain, palpitations and leg swelling.  Gastrointestinal: Negative for abdominal pain, blood in stool, constipation, diarrhea and vomiting.  Endocrine: Negative for cold intolerance, heat intolerance and polydipsia.  Genitourinary: Negative for decreased urine volume, dysuria and hematuria.  Musculoskeletal: Positive for arthralgias. Negative for back pain and gait problem.  Skin: Negative for rash.  Allergic/Immunologic: Positive for environmental allergies.  Neurological: Negative for seizures, syncope, light-headedness and headaches.  Hematological: Negative for adenopathy.  Psychiatric/Behavioral: Negative for agitation, dysphoric mood and suicidal ideas. The patient is not nervous/anxious.     Per HPI unless specifically indicated above     Objective:    BP 110/70 (BP Location: Left Arm, Patient Position: Sitting, Cuff Size: Normal)   Pulse 94   Temp 97.2 F (36.2 C) (Other (Comment))   Ht 5' 4.25" (1.632 m)   Wt 170 lb 8 oz (77.3 kg)   BMI 29.04 kg/m   Wt Readings from Last 3 Encounters:  09/11/16  170 lb 8 oz (77.3 kg)  06/12/16 170 lb 12 oz (77.5 kg)  04/30/16 171 lb (77.6 kg)    Physical Exam  Constitutional: She is oriented to person, place, and time. She appears well-developed and well-nourished.  HENT:  Head: Normocephalic and atraumatic.  Right Ear: Hearing, tympanic membrane, external ear and ear canal normal.  Left Ear: Hearing, tympanic membrane, external ear and ear canal normal.  Nose: Nose normal.  Mouth/Throat: Uvula is midline  and oropharynx is clear and moist. No oropharyngeal exudate.  Neck: Neck supple.  Cardiovascular: Normal rate and regular rhythm.   Pulmonary/Chest: Effort normal and breath sounds normal. She has no wheezes.  Abdominal: Soft. Bowel sounds are normal. She exhibits no mass. There is no hepatosplenomegaly. There is no tenderness.  Musculoskeletal: She exhibits no edema.  Lymphadenopathy:    She has no cervical adenopathy.  Neurological: She is alert and oriented to person, place, and time.  Skin: Skin is warm and dry.  Psychiatric: She has a normal mood and affect. Her behavior is normal.  Vitals reviewed.   Results for orders placed or performed in visit on 06/12/16  Hemoglobin  Result Value Ref Range   Hemoglobin 13.2 11.7 - 15.5 g/dL  Hematocrit  Result Value Ref Range   HCT 40.9 35.0 - 45.0 %      Assessment & Plan:    Encounter Diagnoses  Name Primary?  Marland Kitchen Anemia, unspecified type Yes  . Anxiety   . Chronic obstructive pulmonary disease, unspecified COPD type (HCC)   . Seasonal allergic rhinitis due to pollen   . Dysfunction of both eustachian tubes     -Check h/h.  If good can decrease checks to 6 months -couseled allergies and eustachian tubes recommended claritan or silmilar.  Can take sudafed if needed. -follow up 6 months.  RTO sooner prn

## 2017-01-10 ENCOUNTER — Other Ambulatory Visit (HOSPITAL_COMMUNITY): Payer: Self-pay | Admitting: *Deleted

## 2017-01-10 DIAGNOSIS — Z09 Encounter for follow-up examination after completed treatment for conditions other than malignant neoplasm: Secondary | ICD-10-CM

## 2017-01-10 DIAGNOSIS — N6320 Unspecified lump in the left breast, unspecified quadrant: Secondary | ICD-10-CM

## 2017-01-29 ENCOUNTER — Other Ambulatory Visit: Payer: Self-pay | Admitting: Physician Assistant

## 2017-01-29 ENCOUNTER — Ambulatory Visit (HOSPITAL_COMMUNITY)
Admission: RE | Admit: 2017-01-29 | Discharge: 2017-01-29 | Disposition: A | Discharge status: Home / Self Care | Payer: PRIVATE HEALTH INSURANCE | Source: Ambulatory Visit | Attending: *Deleted | Admitting: *Deleted

## 2017-01-29 DIAGNOSIS — Z09 Encounter for follow-up examination after completed treatment for conditions other than malignant neoplasm: Secondary | ICD-10-CM

## 2017-01-29 DIAGNOSIS — N6324 Unspecified lump in the left breast, lower inner quadrant: Secondary | ICD-10-CM | POA: Diagnosis not present

## 2017-01-29 DIAGNOSIS — N6320 Unspecified lump in the left breast, unspecified quadrant: Secondary | ICD-10-CM

## 2017-01-29 MED ORDER — SERTRALINE HCL 100 MG PO TABS: 100.0000 mg | ORAL_TABLET | Freq: Every day | ORAL | 0 refills | Status: DC

## 2017-02-25 ENCOUNTER — Ambulatory Visit: Payer: Self-pay | Admitting: Physician Assistant

## 2017-03-13 ENCOUNTER — Ambulatory Visit: Payer: Self-pay | Admitting: Physician Assistant

## 2017-03-14 ENCOUNTER — Ambulatory Visit: Payer: Self-pay | Admitting: Physician Assistant

## 2017-03-19 ENCOUNTER — Encounter: Payer: Self-pay | Admitting: Physician Assistant

## 2017-03-19 ENCOUNTER — Ambulatory Visit: Payer: Self-pay | Admitting: Physician Assistant

## 2017-03-19 ENCOUNTER — Other Ambulatory Visit: Payer: Self-pay | Admitting: Physician Assistant

## 2017-03-19 VITALS — BP 111/64 | HR 94 | Temp 97.7°F | Ht 64.25 in | Wt 175.8 lb

## 2017-03-19 DIAGNOSIS — F419 Anxiety disorder, unspecified: Secondary | ICD-10-CM

## 2017-03-19 DIAGNOSIS — J449 Chronic obstructive pulmonary disease, unspecified: Secondary | ICD-10-CM

## 2017-03-19 DIAGNOSIS — Z1211 Encounter for screening for malignant neoplasm of colon: Secondary | ICD-10-CM

## 2017-03-19 DIAGNOSIS — D649 Anemia, unspecified: Secondary | ICD-10-CM

## 2017-03-19 MED ORDER — ALBUTEROL SULFATE HFA 108 (90 BASE) MCG/ACT IN AERS: 2.0000 | INHALATION_SPRAY | Freq: Four times a day (QID) | RESPIRATORY_TRACT | 1 refills | Status: DC | PRN

## 2017-03-19 NOTE — Progress Notes (Signed)
BP 111/64 (BP Location: Left Arm, Patient Position: Sitting, Cuff Size: Normal)   Pulse 94   Temp 97.7 F (36.5 C)   Ht 5' 4.25" (1.632 m)   Wt 175 lb 12 oz (79.7 kg)   SpO2 94%   BMI 29.93 kg/m    Subjective:    Patient ID: Amber Lloyd, female    DOB: Apr 01, 1956, 61 y.o.   MRN: [id number]  HPI: Amber Lloyd is a 61 y.o. female presenting on 03/19/2017 for Anemia   HPI   Pt is doing well.  She goes to [facility] for [facility].    She is doing well.   Relevant past medical, surgical, family and social history reviewed and updated as indicated. Interim medical history since our last visit reviewed. Allergies and medications reviewed and updated.   Current Outpatient Prescriptions:  .  albuterol (PROVENTIL HFA;VENTOLIN HFA) 108 (90 Base) MCG/ACT inhaler, Inhale 2 puffs into the lungs every 6 (six) hours as needed for wheezing or shortness of breath., Disp: 3 Inhaler, Rfl: 1 .  Ibuprofen (ADVIL PO), Take 1 tablet by mouth daily as needed. For shoulder pain and headaches , Disp: , Rfl:  .  IRON PO, Take 1 tablet by mouth daily., Disp: , Rfl:  .  loratadine (CLARITIN) 10 MG tablet, Take 10 mg by mouth daily as needed. , Disp: , Rfl:  .  naproxen sodium (ANAPROX) 220 MG tablet, Take 220 mg by mouth daily as needed., Disp: , Rfl:  .  OMEPRAZOLE PO, Take 1 tablet by mouth daily., Disp: , Rfl:  .  sertraline (ZOLOFT) 100 MG tablet, Take 1 tablet (100 mg total) by mouth daily., Disp: 43 tablet, Rfl: 0   Review of Systems  Constitutional: Negative for appetite change, chills, diaphoresis, fatigue, fever and unexpected weight change.  HENT: Negative for congestion, dental problem, drooling, ear pain, facial swelling, hearing loss, mouth sores, sneezing, sore throat, trouble swallowing and voice change.   Eyes: Negative for pain, discharge, redness, itching and visual disturbance.  Respiratory: Positive for shortness of breath. Negative for cough, choking and wheezing.    Cardiovascular: Negative for chest pain, palpitations and leg swelling.  Gastrointestinal: Positive for diarrhea. Negative for abdominal pain, blood in stool, constipation and vomiting.  Endocrine: Negative for cold intolerance, heat intolerance and polydipsia.  Genitourinary: Negative for decreased urine volume, dysuria and hematuria.  Musculoskeletal: Positive for arthralgias. Negative for back pain and gait problem.  Skin: Negative for rash.  Allergic/Immunologic: Positive for environmental allergies.  Neurological: Negative for seizures, syncope, light-headedness and headaches.  Hematological: Negative for adenopathy.  Psychiatric/Behavioral: Positive for agitation. Negative for dysphoric mood and suicidal ideas. The patient is nervous/anxious.     Per HPI unless specifically indicated above     Objective:    BP 111/64 (BP Location: Left Arm, Patient Position: Sitting, Cuff Size: Normal)   Pulse 94   Temp 97.7 F (36.5 C)   Ht 5' 4.25" (1.632 m)   Wt 175 lb 12 oz (79.7 kg)   SpO2 94%   BMI 29.93 kg/m   Wt Readings from Last 3 Encounters:  03/19/17 175 lb 12 oz (79.7 kg)  09/11/16 170 lb 8 oz (77.3 kg)  06/12/16 170 lb 12 oz (77.5 kg)    Physical Exam  Constitutional: She is oriented to person, place, and time. She appears well-developed and well-nourished.  HENT:  Head: Normocephalic and atraumatic.  Neck: Neck supple.  Cardiovascular: Normal rate and regular rhythm.   Pulmonary/Chest: Effort normal  and breath sounds normal.  Abdominal: Soft. Bowel sounds are normal. She exhibits no mass. There is no hepatosplenomegaly. There is no tenderness.  Musculoskeletal: She exhibits no edema.  Lymphadenopathy:    She has no cervical adenopathy.  Neurological: She is alert and oriented to person, place, and time.  Skin: Skin is warm and dry.  Psychiatric: She has a normal mood and affect. Her behavior is normal.  Vitals reviewed.       Assessment & Plan:   Encounter  Diagnoses  Name Primary?  Marland Kitchen Anemia, unspecified type Yes  . Chronic obstructive pulmonary disease, unspecified COPD type (HCC)   . Anxiety   . Special screening for malignant neoplasms, colon     -Pt's medassist was renewed -pt to continue current medications -pt was given ifobt for colon cancer screening -will check Labs - h/h- and call pt with results -pt will follow up in 6 months.  RTO sooner prn

## 2017-03-26 ENCOUNTER — Other Ambulatory Visit (HOSPITAL_COMMUNITY)
Admission: RE | Admit: 2017-03-26 | Discharge: 2017-03-26 | Disposition: A | Discharge status: Home / Self Care | Payer: Self-pay | Source: Ambulatory Visit | Attending: Physician Assistant | Admitting: Physician Assistant

## 2017-03-26 DIAGNOSIS — D649 Anemia, unspecified: Secondary | ICD-10-CM | POA: Insufficient documentation

## 2017-03-26 LAB — IFOBT (OCCULT BLOOD): IMMUNOLOGICAL FECAL OCCULT BLOOD TEST: NEGATIVE

## 2017-03-26 LAB — HEMOGLOBIN AND HEMATOCRIT, BLOOD
HCT: 41 % (ref 36.0–46.0)
Hemoglobin: 13.8 g/dL (ref 12.0–15.0)

## 2017-05-13 ENCOUNTER — Other Ambulatory Visit (HOSPITAL_COMMUNITY)
Admission: RE | Admit: 2017-05-13 | Discharge: 2017-05-13 | Disposition: A | Discharge status: Home / Self Care | Payer: Self-pay | Source: Ambulatory Visit | Attending: Physician Assistant | Admitting: Physician Assistant

## 2017-05-13 ENCOUNTER — Ambulatory Visit (HOSPITAL_COMMUNITY)
Admission: RE | Admit: 2017-05-13 | Discharge: 2017-05-13 | Disposition: A | Discharge status: Home / Self Care | Payer: Self-pay | Source: Ambulatory Visit | Attending: Physician Assistant | Admitting: Physician Assistant

## 2017-05-13 ENCOUNTER — Ambulatory Visit: Payer: Self-pay | Admitting: Physician Assistant

## 2017-05-13 ENCOUNTER — Encounter: Payer: Self-pay | Admitting: Physician Assistant

## 2017-05-13 VITALS — BP 100/70 | HR 101 | Temp 98.1°F | Wt 176.5 lb

## 2017-05-13 DIAGNOSIS — J449 Chronic obstructive pulmonary disease, unspecified: Secondary | ICD-10-CM

## 2017-05-13 DIAGNOSIS — R0602 Shortness of breath: Secondary | ICD-10-CM | POA: Insufficient documentation

## 2017-05-13 DIAGNOSIS — J441 Chronic obstructive pulmonary disease with (acute) exacerbation: Secondary | ICD-10-CM

## 2017-05-13 LAB — CBC WITH DIFFERENTIAL/PLATELET
BASOS PCT: 0 %
Basophils Absolute: 0 10*3/uL (ref 0.0–0.1)
Eosinophils Absolute: 0.4 10*3/uL (ref 0.0–0.7)
Eosinophils Relative: 6 %
HEMATOCRIT: 46.5 % — AB (ref 36.0–46.0)
HEMOGLOBIN: 15 g/dL (ref 12.0–15.0)
LYMPHS ABS: 1.8 10*3/uL (ref 0.7–4.0)
Lymphocytes Relative: 26 %
MCH: 31.6 pg (ref 26.0–34.0)
MCHC: 32.3 g/dL (ref 30.0–36.0)
MCV: 97.9 fL (ref 78.0–100.0)
MONO ABS: 0.6 10*3/uL (ref 0.1–1.0)
MONOS PCT: 8 %
NEUTROS ABS: 4 10*3/uL (ref 1.7–7.7)
NEUTROS PCT: 60 %
Platelets: 187 10*3/uL (ref 150–400)
RBC: 4.75 MIL/uL (ref 3.87–5.11)
RDW: 13 % (ref 11.5–15.5)
WBC: 6.7 10*3/uL (ref 4.0–10.5)

## 2017-05-13 LAB — BASIC METABOLIC PANEL
ANION GAP: 9 (ref 5–15)
BUN: 8 mg/dL (ref 6–20)
CALCIUM: 9.5 mg/dL (ref 8.9–10.3)
CO2: 25 mmol/L (ref 22–32)
Chloride: 101 mmol/L (ref 101–111)
Creatinine, Ser: 0.74 mg/dL (ref 0.44–1.00)
GFR calc non Af Amer: >60 mL/min (ref >60)
Glucose, Bld: 111 mg/dL — ABNORMAL HIGH (ref 65–99)
Potassium: 4 mmol/L (ref 3.5–5.1)
Sodium: 135 mmol/L (ref 135–145)

## 2017-05-13 MED ORDER — ALBUTEROL SULFATE (2.5 MG/3ML) 0.083% IN NEBU: 2.5000 mg | INHALATION_SOLUTION | Freq: Four times a day (QID) | RESPIRATORY_TRACT | 2 refills | Status: DC | PRN

## 2017-05-13 MED ORDER — PREDNISONE 50 MG PO TABS: ORAL_TABLET | ORAL | 0 refills | Status: DC

## 2017-05-13 MED ORDER — MOMETASONE FURO-FORMOTEROL FUM 100-5 MCG/ACT IN AERO: 2.0000 | INHALATION_SPRAY | Freq: Two times a day (BID) | RESPIRATORY_TRACT | 3 refills | Status: DC

## 2017-05-13 MED ORDER — ALBUTEROL SULFATE HFA 108 (90 BASE) MCG/ACT IN AERS: 2.0000 | INHALATION_SPRAY | Freq: Four times a day (QID) | RESPIRATORY_TRACT | 1 refills | Status: DC | PRN

## 2017-05-13 MED ORDER — IPRATROPIUM-ALBUTEROL 0.5-2.5 (3) MG/3ML IN SOLN
3.0000 mL | Freq: Once | RESPIRATORY_TRACT | Status: AC
  Administered 2017-05-13: 3 mL via RESPIRATORY_TRACT

## 2017-05-13 NOTE — Progress Notes (Signed)
BP 100/70 (BP Location: Left Arm, Patient Position: Sitting, Cuff Size: Normal)   Pulse (!) 101   Temp 98.1 F (36.7 C)   Wt 176 lb 8 oz (80.1 kg)   SpO2 90%   BMI 30.06 kg/m    Subjective:    Patient ID: Amber Lloyd, female    DOB: 06/13/1955, 61 y.o.   MRN: [id number]  HPI: Amber Lloyd is a 61 y.o. female presenting on 05/13/2017 for Shortness of Breath (gotten worse since friday. pt states has been going on for about a month. pt states over the weekend she has gone "completely black" for a couple seconds, but has not lost consciousness. pt reports O2 stats running between 70-80. when patients sits and rests it goes up)   HPI   Chief Complaint  Patient presents with  . Shortness of Breath    gotten worse since friday. pt states has been going on for about a month. pt states over the weekend she has gone "completely black" for a couple seconds, but has not lost consciousness. pt reports O2 stats running between 70-80. when patients sits and rests it goes up     Pt has had URI cough and cong going back and forth past few weeks.  She thinks she had fever that broke on Saturday because she awakened from sleep in a sweat.  Pt c/o DOE for past few months.  She has been using her inhaler more but doesn't like using it because it makes her jittery.   Pt stopped her psych meds because "something wasn't right".  She went to [facility] on Friday but the doctor wasn't there.  She is working with [facility] to get her meds right.    Pt today in office has O2 sats 92-94%  at rest.  Goes down to 87 % on exertion.   Pt does not want to go to hospital if she can avoid it.  Relevant past medical, surgical, family and social history reviewed and updated as indicated. Interim medical history since our last visit reviewed. Allergies and medications reviewed and updated.   Current Outpatient Medications:  .  albuterol (PROVENTIL HFA;VENTOLIN HFA) 108 (90 Base) MCG/ACT inhaler, Inhale 2 puffs  into the lungs every 6 (six) hours as needed for wheezing or shortness of breath., Disp: 3 Inhaler, Rfl: 1 .  Ibuprofen (ADVIL PO), Take 1 tablet by mouth daily as needed. For shoulder pain and headaches , Disp: , Rfl:  .  IRON PO, Take 1 tablet by mouth daily., Disp: , Rfl:  .  naproxen sodium (ANAPROX) 220 MG tablet, Take 220 mg by mouth daily as needed., Disp: , Rfl:  .  OMEPRAZOLE PO, Take 1 tablet by mouth daily as needed. , Disp: , Rfl:  .  loratadine (CLARITIN) 10 MG tablet, Take 10 mg by mouth daily as needed. , Disp: , Rfl:  .  nortriptyline (PAMELOR) 25 MG capsule, Take 25 mg by mouth at bedtime., Disp: , Rfl:  .  nortriptyline (PAMELOR) 50 MG capsule, Take 50 mg by mouth at bedtime., Disp: , Rfl:  .  sertraline (ZOLOFT) 100 MG tablet, Take 1 tablet (100 mg total) by mouth daily. (Patient not taking: Reported on 05/13/2017), Disp: 43 tablet, Rfl: 0 .  sertraline (ZOLOFT) 50 MG tablet, Take 50 mg by mouth daily., Disp: , Rfl:    Review of Systems  Constitutional: Positive for fatigue. Negative for appetite change, chills, diaphoresis, fever and unexpected weight change.  HENT: Positive for congestion  and sneezing. Negative for dental problem, drooling, ear pain, facial swelling, hearing loss, mouth sores, sore throat, trouble swallowing and voice change.   Eyes: Negative for pain, discharge, redness, itching and visual disturbance.  Respiratory: Positive for cough and shortness of breath. Negative for choking and wheezing.   Cardiovascular: Negative for chest pain, palpitations and leg swelling.  Gastrointestinal: Positive for constipation. Negative for abdominal pain, blood in stool, diarrhea and vomiting.  Endocrine: Positive for cold intolerance. Negative for heat intolerance and polydipsia.  Genitourinary: Negative for decreased urine volume, dysuria and hematuria.  Musculoskeletal: Negative for arthralgias, back pain and gait problem.  Skin: Negative for rash.   Allergic/Immunologic: Negative for environmental allergies.  Neurological: Positive for light-headedness and headaches. Negative for seizures and syncope.  Hematological: Negative for adenopathy.  Psychiatric/Behavioral: Negative for agitation, dysphoric mood and suicidal ideas. The patient is nervous/anxious.     Per HPI unless specifically indicated above     Objective:    BP 100/70 (BP Location: Left Arm, Patient Position: Sitting, Cuff Size: Normal)   Pulse (!) 101   Temp 98.1 F (36.7 C)   Wt 176 lb 8 oz (80.1 kg)   SpO2 90%   BMI 30.06 kg/m   Wt Readings from Last 3 Encounters:  05/13/17 176 lb 8 oz (80.1 kg)  03/19/17 175 lb 12 oz (79.7 kg)  09/11/16 170 lb 8 oz (77.3 kg)     PE:  NAD. A&O. Skin w&d HEENT: benign Neck supple without adenopathy Lungs CTA bilaterally Cor RRR without mrg abd +bs soft nontender. No HSM LE without edema  -pt reports much improvement with neb treatment given in office    Assessment & Plan:    Encounter Diagnoses  Name Primary?  . SOB (shortness of breath) Yes  . Chronic obstructive pulmonary disease with acute exacerbation (HCC)   . Shortness of breath      -get cxr today -pt was given [facility] discount application -get cbc, bmp today -rx prednisone -pt is given a loaner nebulizer machine that she will RTO when doing better.  She is given rx for nebulizer albuterol -Rx albuterol and dulera to [facility]. -pt to follow up next week for recheck.  Counseled her to go to ER if she worsens

## 2017-05-23 ENCOUNTER — Ambulatory Visit: Payer: Self-pay | Admitting: Physician Assistant

## 2017-05-23 ENCOUNTER — Encounter: Payer: Self-pay | Admitting: Physician Assistant

## 2017-05-23 VITALS — BP 113/71 | HR 89 | Temp 97.8°F | Resp 18

## 2017-05-23 DIAGNOSIS — R0602 Shortness of breath: Secondary | ICD-10-CM

## 2017-05-23 DIAGNOSIS — J849 Interstitial pulmonary disease, unspecified: Secondary | ICD-10-CM

## 2017-05-23 DIAGNOSIS — J449 Chronic obstructive pulmonary disease, unspecified: Secondary | ICD-10-CM

## 2017-05-23 NOTE — Progress Notes (Signed)
BP 113/71   Pulse 89   Temp 97.8 F (36.6 C)   Resp 18   SpO2 90%    Subjective:    Patient ID: Amber Lloyd, female    DOB: 19-Oct-1955, 61 y.o.   MRN: [id number]  HPI: Amber Lloyd is a 61 y.o. female presenting on 05/23/2017 for No chief complaint on file.   HPI   She is feeling better but says she is having problems getting back her energy.  Says the least exertion just wears her out.   Pt did not get [employer] discount application turned in  Pt after resting O2 goes to 94%.  With mild exertion it goes down to 89%.    Relevant past medical, surgical, family and social history reviewed and updated as indicated. Interim medical history since our last visit reviewed. Allergies and medications reviewed and updated.   Current Outpatient Medications:  .  albuterol (PROVENTIL HFA;VENTOLIN HFA) 108 (90 Base) MCG/ACT inhaler, Inhale 2 puffs into the lungs every 6 (six) hours as needed for wheezing or shortness of breath., Disp: 3 Inhaler, Rfl: 1 .  albuterol (PROVENTIL) (2.5 MG/3ML) 0.083% nebulizer solution, Take 3 mLs (2.5 mg total) by nebulization every 6 (six) hours as needed for wheezing or shortness of breath., Disp: 75 mL, Rfl: 2 .  Ibuprofen (ADVIL PO), Take 1 tablet by mouth daily as needed. For shoulder pain and headaches , Disp: , Rfl:  .  IRON PO, Take 1 tablet by mouth daily., Disp: , Rfl:  .  mometasone-formoterol (DULERA) 100-5 MCG/ACT AERO, Inhale 2 puffs into the lungs 2 (two) times daily., Disp: 3 Inhaler, Rfl: 3 .  OMEPRAZOLE PO, Take 1 tablet by mouth daily as needed. , Disp: , Rfl:  .  sertraline (ZOLOFT) 50 MG tablet, Take 50 mg by mouth daily., Disp: , Rfl:    Review of Systems  Constitutional: Positive for fatigue. Negative for appetite change, chills, diaphoresis, fever and unexpected weight change.  HENT: Positive for congestion and sneezing. Negative for dental problem, drooling, ear pain, facial swelling, hearing loss, mouth sores, sore throat, trouble  swallowing and voice change.   Eyes: Negative for pain, discharge, redness, itching and visual disturbance.  Respiratory: Positive for cough, shortness of breath and wheezing. Negative for choking.   Cardiovascular: Positive for palpitations. Negative for chest pain and leg swelling.  Gastrointestinal: Positive for constipation. Negative for abdominal pain, blood in stool, diarrhea and vomiting.  Endocrine: Positive for cold intolerance and polydipsia. Negative for heat intolerance.  Genitourinary: Negative for decreased urine volume, dysuria and hematuria.  Musculoskeletal: Positive for arthralgias. Negative for back pain and gait problem.  Skin: Negative for rash.  Allergic/Immunologic: Negative for environmental allergies.  Neurological: Positive for light-headedness. Negative for seizures, syncope and headaches.  Hematological: Negative for adenopathy.  Psychiatric/Behavioral: Positive for agitation and dysphoric mood. Negative for suicidal ideas. The patient is nervous/anxious.     Per HPI unless specifically indicated above     Objective:    BP 113/71   Pulse 89   Temp 97.8 F (36.6 C)   Resp 18   SpO2 90%   Wt Readings from Last 3 Encounters:  05/13/17 176 lb 8 oz (80.1 kg)  03/19/17 175 lb 12 oz (79.7 kg)  09/11/16 170 lb 8 oz (77.3 kg)    Physical Exam  Constitutional: She is oriented to person, place, and time. She appears well-developed and well-nourished.  HENT:  Head: Normocephalic and atraumatic.  Neck: Neck supple.  Cardiovascular: Normal  rate and regular rhythm.  Pulmonary/Chest: Effort normal. No respiratory distress. She has no wheezes. She has no rales.  Abdominal: Soft. Bowel sounds are normal. There is no tenderness.  Musculoskeletal: She exhibits no edema.  Neurological: She is alert and oriented to person, place, and time.  Skin: Skin is warm and dry.  Psychiatric: She has a normal mood and affect. Her behavior is normal.  Vitals  reviewed.   Results for orders placed or performed during the hospital encounter of 05/13/17  Basic metabolic panel  Result Value Ref Range   Sodium 135 135 - 145 mmol/L   Potassium 4.0 3.5 - 5.1 mmol/L   Chloride 101 101 - 111 mmol/L   CO2 25 22 - 32 mmol/L   Glucose, Bld 111 (H) 65 - 99 mg/dL   BUN 8 6 - 20 mg/dL   Creatinine, Ser 8.67 0.44 - 1.00 mg/dL   Calcium 9.5 8.9 - 61.9 mg/dL   GFR calc non Af Amer >60 >60 mL/min   GFR calc Af Amer >60 >60 mL/min   Anion gap 9 5 - 15  CBC w/Diff/Platelet  Result Value Ref Range   WBC 6.7 4.0 - 10.5 K/uL   RBC 4.75 3.87 - 5.11 MIL/uL   Hemoglobin 15.0 12.0 - 15.0 g/dL   HCT 50.9 (H) 32.6 - 71.2 %   MCV 97.9 78.0 - 100.0 fL   MCH 31.6 26.0 - 34.0 pg   MCHC 32.3 30.0 - 36.0 g/dL   RDW 45.8 09.9 - 83.3 %   Platelets 187 150 - 400 K/uL   Neutrophils Relative % 60 %   Neutro Abs 4.0 1.7 - 7.7 K/uL   Lymphocytes Relative 26 %   Lymphs Abs 1.8 0.7 - 4.0 K/uL   Monocytes Relative 8 %   Monocytes Absolute 0.6 0.1 - 1.0 K/uL   Eosinophils Relative 6 %   Eosinophils Absolute 0.4 0.0 - 0.7 K/uL   Basophils Relative 0 %   Basophils Absolute 0.0 0.0 - 0.1 K/uL      Assessment & Plan:   Encounter Diagnoses  Name Primary?  . ILD (interstitial lung disease) (HCC) Yes  . SOB (shortness of breath)   . Chronic obstructive pulmonary disease, unspecified COPD type (HCC)   \  -Reviewed cxr and labs with pt -Refer to pulmonary -Pt to get [employer] discount application turned in.  -pt to follow up 1 month.  RTO sooner prn worsening or new symptoms

## 2017-06-18 ENCOUNTER — Encounter: Payer: Self-pay | Admitting: Pulmonary Disease

## 2017-06-18 ENCOUNTER — Ambulatory Visit (INDEPENDENT_AMBULATORY_CARE_PROVIDER_SITE_OTHER): Payer: Self-pay | Admitting: Pulmonary Disease

## 2017-06-18 ENCOUNTER — Other Ambulatory Visit: Payer: Self-pay

## 2017-06-18 DIAGNOSIS — J849 Interstitial pulmonary disease, unspecified: Secondary | ICD-10-CM

## 2017-06-18 DIAGNOSIS — J449 Chronic obstructive pulmonary disease, unspecified: Secondary | ICD-10-CM

## 2017-06-18 MED ORDER — AZITHROMYCIN 250 MG PO TABS: ORAL_TABLET | ORAL | 0 refills | Status: AC

## 2017-06-18 NOTE — Patient Instructions (Addendum)
We will check alpha-1 antitrypsin level We will give you a prescription for Z-Pak Schedule you for pulmonary function test and high-resolution CT of the chest next week Follow-up in clinic in 1 month.

## 2017-06-18 NOTE — Progress Notes (Signed)
Amber Lloyd    [id number]    11-25-1955  Primary Care Physician:McElroy, Willodean Rosenthal  Referring Physician: Jacquelin Hawking, PA-C [address] [place], [place] [address]  Chief complaint: Consult for evaluation of interstitial lung disease  HPI: 62 year old with history of allergies, anemia, depression, irritable bowel syndrome.  She has complaints of dyspnea on exertion for many months.  This worsened in mid December associated with dizziness and presyncope.  She is evaluated by primary care who gave her prednisone and nebulizer with mild improvement.  Still has ongoing symptoms associated with cough, yellow mucus.  Denies any fevers, chills and she had a chest x-ray in December which shows interstitial opacities and has been referred to pulmonary for further evaluation.  Family history significant for alpha-1 antitrypsin deficiency.  She had been evaluated in 2010 with MZ phenotype and normal levels.  She denies any rash, joint pain, joint stiffness.  Pets: 3 cats, 4 dogs, turtle, fish, frog.  No birds or farm animals Occupation: Facilities manager for patients with dementia Exposures: Has leaky basement, possible mold Smoking history: 30-pack-year smoking history.  Quit in 2009 Travel History: Not significant  Outpatient Encounter Medications as of 06/18/2017  Medication Sig  . albuterol (PROVENTIL HFA;VENTOLIN HFA) 108 (90 Base) MCG/ACT inhaler Inhale 2 puffs into the lungs every 6 (six) hours as needed for wheezing or shortness of breath.  Marland Kitchen albuterol (PROVENTIL) (2.5 MG/3ML) 0.083% nebulizer solution Take 3 mLs (2.5 mg total) by nebulization every 6 (six) hours as needed for wheezing or shortness of breath.  . Ibuprofen (ADVIL PO) Take 1 tablet by mouth daily as needed. For shoulder pain and headaches   . IRON PO Take 1 tablet by mouth daily.  Marland Kitchen OMEPRAZOLE PO Take 1 tablet by mouth daily as needed.   . sertraline (ZOLOFT) 50 MG tablet Take 100 mg by mouth daily.   .  [DISCONTINUED] mometasone-formoterol (DULERA) 100-5 MCG/ACT AERO Inhale 2 puffs into the lungs 2 (two) times daily.   No facility-administered encounter medications on file as of 06/18/2017.     Allergies as of 06/18/2017  . (No Known Allergies)    Past Medical History:  Diagnosis Date  . Anemia   . Anxiety   . Depression   . GERD (gastroesophageal reflux disease)   . IBS (irritable bowel syndrome)   . ILD (interstitial lung disease) (HCC)     Past Surgical History:  Procedure Laterality Date  . ABDOMINAL HYSTERECTOMY    . PARTIAL HYSTERECTOMY Right 1983  . TUBAL LIGATION  1981    Family History  Problem Relation Age of Onset  . Alzheimer's disease Mother   . Anemia Mother   . Anemia Sister   . Anemia Daughter   . Anemia Sister     Social History   Socioeconomic History  . Marital status: Married    Spouse name: Not on file  . Number of children: Not on file  . Years of education: Not on file  . Highest education level: Not on file  Social Needs  . Financial resource strain: Not on file  . Food insecurity - worry: Not on file  . Food insecurity - inability: Not on file  . Transportation needs - medical: Not on file  . Transportation needs - non-medical: Not on file  Occupational History  . Not on file  Tobacco Use  . Smoking status: Former Smoker    Packs/day: 1.00    Years: 34.00  Pack years: 34.00    Types: Cigarettes    Last attempt to quit: 07/27/2007    Years since quitting: 9.9  . Smokeless tobacco: Never Used  Substance and Sexual Activity  . Alcohol use: No  . Drug use: No  . Sexual activity: No    Birth control/protection: None  Other Topics Concern  . Not on file  Social History Narrative  . Not on file    Review of systems: Review of Systems  Constitutional: Negative for fever and chills.  HENT: Negative.   Eyes: Negative for blurred vision.  Respiratory: as per HPI  Cardiovascular: Negative for chest pain and palpitations.    Gastrointestinal: Negative for vomiting, diarrhea, blood per rectum. Genitourinary: Negative for dysuria, urgency, frequency and hematuria.  Musculoskeletal: Negative for myalgias, back pain and joint pain.  Skin: Negative for itching and rash.  Neurological: Negative for dizziness, tremors, focal weakness, seizures and loss of consciousness.  Endo/Heme/Allergies: Negative for environmental allergies.  Psychiatric/Behavioral: Negative for depression, suicidal ideas and hallucinations.  All other systems reviewed and are negative.  Physical Exam: There were no vitals taken for this visit. Gen:      No acute distress HEENT:  EOMI, sclera anicteric Neck:     No masses; no thyromegaly Lungs:    Clear to auscultation bilaterally; normal respiratory effort CV:         Regular rate and rhythm; no murmurs Abd:      + bowel sounds; soft, non-tender; no palpable masses, no distension Ext:    No edema; adequate peripheral perfusion Skin:      Warm and dry; no rash Neuro: alert and oriented x 3 Psych: normal mood and affect  Data Reviewed: Chest x-ray 03/01/08-  Clear lungs, no acute cardiopulmonary disease. Chest x-ray 05/13/17-bilateral interstitial opacities, more at the bases.  New from 2009. Reviewed the images personally  Alpha-1 antitrypsin 02/16/09-PIMZ, 114 mg/dL  CBC 61/60/73-XTG 6.7, eosinophils 6%, absolute eosinophil count 400  Assessment:  Abnormal chest x-ray. ?  Interstitial lung disease Unclear etiology.  Will get resolution CT for better evaluation.  She does not have any significant exposures except for possible mold in basement.  There are no signs or symptoms of connective tissue disease Although her clinical presentation is not very typical she may be having community acquired pneumonia.  We will treat her with a course of azithromycin empirically before she gets her CT scan.  Further workup based on review of her scan.  Family history of alpha-1 antitrypsin  deficiency MZ phenotype Schedule pulmonary function test and repeat alpha-1 antitrypsin level.  Plan/Recommendations: - Z pack - High res CT, PFTs - A1AT levels  Chilton Greathouse MD [facility] Pager [phone] 06/18/2017, 1:56 PM  CC: Jacquelin Hawking, PA-C

## 2017-06-19 LAB — ALPHA-1-ANTITRYPSIN: A-1 Antitrypsin, Ser: 112 mg/dL (ref 83–199)

## 2017-06-20 ENCOUNTER — Encounter: Payer: Self-pay | Admitting: Pulmonary Disease

## 2017-06-24 ENCOUNTER — Ambulatory Visit: Payer: Self-pay | Admitting: Physician Assistant

## 2017-06-24 ENCOUNTER — Encounter: Payer: Self-pay | Admitting: Physician Assistant

## 2017-06-24 VITALS — BP 96/64 | HR 88 | Temp 98.1°F | Wt 173.0 lb

## 2017-06-24 DIAGNOSIS — F419 Anxiety disorder, unspecified: Secondary | ICD-10-CM

## 2017-06-24 DIAGNOSIS — R0602 Shortness of breath: Secondary | ICD-10-CM

## 2017-06-24 NOTE — Progress Notes (Signed)
BP 96/64   Pulse 88   Temp 98.1 F (36.7 C)   Wt 173 lb (78.5 kg)   SpO2 91%   BMI 29.46 kg/m    Subjective:    Patient ID: Amber Lloyd, female    DOB: Oct 10, 1955, 62 y.o.   MRN: [id number]  HPI: [name] is a 62 y.o. female presenting on 06/24/2017 for Follow-up   HPI   Pt's husband died unexprectedly since her last OV.  She has 3 grandchildren living with her- 57, 53, 30 yo.  She has seen pulmonologist and has testing scheduled including CT, PFTs and has a follow up OV in 1 month.   Pt was already going to counseling but hasn't been in past few weeks duing to being so busy.   Relevant past medical, surgical, family and social history reviewed and updated as indicated. Interim medical history since our last visit reviewed. Allergies and medications reviewed and updated.   Current Outpatient Medications:  .  albuterol (PROVENTIL HFA;VENTOLIN HFA) 108 (90 Base) MCG/ACT inhaler, Inhale 2 puffs into the lungs every 6 (six) hours as needed for wheezing or shortness of breath., Disp: 3 Inhaler, Rfl: 1 .  albuterol (PROVENTIL) (2.5 MG/3ML) 0.083% nebulizer solution, Take 3 mLs (2.5 mg total) by nebulization every 6 (six) hours as needed for wheezing or shortness of breath., Disp: 75 mL, Rfl: 2 .  Ibuprofen (ADVIL PO), Take 1 tablet by mouth daily as needed. For shoulder pain and headaches , Disp: , Rfl:  .  IRON PO, Take 1 tablet by mouth daily., Disp: , Rfl:  .  OMEPRAZOLE PO, Take 1 tablet by mouth daily as needed. , Disp: , Rfl:  .  sertraline (ZOLOFT) 50 MG tablet, Take 100 mg by mouth daily. , Disp: , Rfl:    Review of Systems  Constitutional: Positive for fatigue. Negative for appetite change, chills, diaphoresis, fever and unexpected weight change.  HENT: Positive for congestion and sneezing. Negative for dental problem, drooling, ear pain, facial swelling, hearing loss, mouth sores, sore throat, trouble swallowing and voice change.   Eyes: Negative for pain,  discharge, redness, itching and visual disturbance.  Respiratory: Positive for cough, chest tightness, shortness of breath and wheezing. Negative for choking.   Cardiovascular: Negative for chest pain, palpitations and leg swelling.  Gastrointestinal: Negative for abdominal pain, blood in stool, constipation, diarrhea and vomiting.  Endocrine: Positive for cold intolerance and heat intolerance. Negative for polydipsia.  Genitourinary: Negative for decreased urine volume, dysuria and hematuria.  Musculoskeletal: Positive for arthralgias and gait problem. Negative for back pain.  Skin: Negative for rash.  Allergic/Immunologic: Positive for environmental allergies.  Neurological: Negative for seizures, syncope, light-headedness and headaches.  Hematological: Negative for adenopathy.  Psychiatric/Behavioral: Negative for agitation, dysphoric mood and suicidal ideas. The patient is nervous/anxious.     Per HPI unless specifically indicated above     Objective:    BP 96/64   Pulse 88   Temp 98.1 F (36.7 C)   Wt 173 lb (78.5 kg)   SpO2 91%   BMI 29.46 kg/m   Wt Readings from Last 3 Encounters:  06/24/17 173 lb (78.5 kg)  05/13/17 176 lb 8 oz (80.1 kg)  03/19/17 175 lb 12 oz (79.7 kg)    Physical Exam  Constitutional: She is oriented to person, place, and time. She appears well-developed and well-nourished.  HENT:  Head: Normocephalic and atraumatic.  Neck: Neck supple.  Cardiovascular: Normal rate, regular rhythm and normal heart sounds.  Pulmonary/Chest: Breath sounds normal. No respiratory distress. She has no wheezes. She has no rales.  Musculoskeletal: She exhibits no edema.  Neurological: She is alert and oriented to person, place, and time.  Skin: Skin is warm and dry.  Psychiatric: She has a normal mood and affect. Her behavior is normal.  Vitals reviewed.       Assessment & Plan:   Encounter Diagnoses  Name Primary?  . SOB (shortness of breath) Yes  . Anxiety      -pt to continue with work-up per pulmonologist -pt encouraged to follow up with counselor -pt was given temporary handicapped placard application for [employer] per her request.  Discussed with her that if her specialist feels that she is going to need a permanent placard due to his findings, he can provide that for her -pt to follow up here in April as scheduled.  She is to RTO sooner prn

## 2017-06-25 ENCOUNTER — Ambulatory Visit (HOSPITAL_COMMUNITY)
Admission: RE | Admit: 2017-06-25 | Discharge: 2017-06-25 | Disposition: A | Discharge status: Home / Self Care | Payer: Self-pay | Source: Ambulatory Visit | Attending: Pulmonary Disease | Admitting: Pulmonary Disease

## 2017-06-25 DIAGNOSIS — J449 Chronic obstructive pulmonary disease, unspecified: Secondary | ICD-10-CM | POA: Insufficient documentation

## 2017-06-25 DIAGNOSIS — J849 Interstitial pulmonary disease, unspecified: Secondary | ICD-10-CM | POA: Insufficient documentation

## 2017-06-25 DIAGNOSIS — J438 Other emphysema: Secondary | ICD-10-CM | POA: Insufficient documentation

## 2017-06-25 DIAGNOSIS — I700 Atherosclerosis of aorta: Secondary | ICD-10-CM | POA: Insufficient documentation

## 2017-06-25 LAB — PULMONARY FUNCTION TEST
DL/VA % PRED: 33 %
DL/VA: 1.61 ml/min/mmHg/L
DLCO UNC: 4.96 ml/min/mmHg
DLCO unc % pred: 20 %
FEF 25-75 POST: 2.67 L/s
FEF 25-75 Pre: 2.2 L/sec
FEF2575-%CHANGE-POST: 21 %
FEF2575-%PRED-POST: 120 %
FEF2575-%Pred-Pre: 99 %
FEV1-%CHANGE-POST: 5 %
FEV1-%PRED-PRE: 75 %
FEV1-%Pred-Post: 79 %
FEV1-Post: 1.97 L
FEV1-Pre: 1.87 L
FEV1FVC-%CHANGE-POST: 0 %
FEV1FVC-%PRED-PRE: 108 %
FEV6-%Change-Post: 6 %
FEV6-%Pred-Post: 76 %
FEV6-%Pred-Pre: 71 %
FEV6-Post: 2.37 L
FEV6-Pre: 2.23 L
FEV6FVC-%Pred-Post: 104 %
FEV6FVC-%Pred-Pre: 104 %
FVC-%CHANGE-POST: 6 %
FVC-%PRED-POST: 73 %
FVC-%Pred-Pre: 69 %
FVC-POST: 2.37 L
FVC-Pre: 2.23 L
POST FEV1/FVC RATIO: 83 %
PRE FEV1/FVC RATIO: 84 %
Post FEV6/FVC ratio: 100 %
Pre FEV6/FVC Ratio: 100 %
RV % pred: 67 %
RV: 1.38 L
TLC % pred: 75 %
TLC: 3.79 L

## 2017-06-25 MED ORDER — ALBUTEROL SULFATE (2.5 MG/3ML) 0.083% IN NEBU
2.5000 mg | INHALATION_SOLUTION | Freq: Once | RESPIRATORY_TRACT | Status: AC
Start: 2017-06-25 — End: 2017-06-25
  Administered 2017-06-25: 2.5 mg via RESPIRATORY_TRACT

## 2017-06-27 ENCOUNTER — Encounter (HOSPITAL_COMMUNITY): Payer: Self-pay

## 2017-07-02 ENCOUNTER — Other Ambulatory Visit: Payer: Self-pay | Admitting: Pulmonary Disease

## 2017-07-02 DIAGNOSIS — J441 Chronic obstructive pulmonary disease with (acute) exacerbation: Secondary | ICD-10-CM

## 2017-07-03 ENCOUNTER — Telehealth: Payer: Self-pay | Admitting: Pulmonary Disease

## 2017-07-03 NOTE — Telephone Encounter (Signed)
Spoke with pt, advised that she does not need to fast prior to labwork.  Also verified with pt that labs were the only additional test to be ordered on her.  Nothing further needed.

## 2017-07-05 ENCOUNTER — Other Ambulatory Visit: Payer: Self-pay | Admitting: *Deleted

## 2017-07-05 ENCOUNTER — Other Ambulatory Visit (INDEPENDENT_AMBULATORY_CARE_PROVIDER_SITE_OTHER): Payer: Self-pay

## 2017-07-05 DIAGNOSIS — J441 Chronic obstructive pulmonary disease with (acute) exacerbation: Secondary | ICD-10-CM

## 2017-07-05 DIAGNOSIS — J449 Chronic obstructive pulmonary disease, unspecified: Secondary | ICD-10-CM

## 2017-07-05 LAB — SEDIMENTATION RATE: SED RATE: 32 mm/h — AB (ref 0–30)

## 2017-07-05 LAB — HIGH SENSITIVITY CRP: CRP, High Sensitivity: 3.76 mg/L (ref 0.000–5.000)

## 2017-07-06 LAB — SPECIMEN STATUS REPORT

## 2017-07-06 LAB — CENTROMERE ANTIBODIES

## 2017-07-12 LAB — HYPERCOAGULABLE PANEL, COMPREHENSIVE
ANTICARDIOLIPIN AB, IGM: 49 [MPL'U] — AB
APTT: 28.3 s
AT III ACT/NOR PPP CHRO: 93 %
Act. Prt C Resist w/FV Defic.: 2.6 ratio
Beta-2 Glycoprotein I, IgA: <10 SAU
Beta-2 Glycoprotein I, IgG: <10 SGU
Beta-2 Glycoprotein I, IgM: <10 SMU
DRVVT Screen Seconds: 41.6 s
FACTOR VIII ACTIVITY: 98 %
Factor VII Antigen**: 183 % — ABNORMAL HIGH
HEXAGONAL PHOSPHOLIPID NEUTRAL: 11 s
HOMOCYSTEINE: 8.7 umol/L
Prot C Ag Act/Nor PPP Imm: 95 %
Prot S Ag Act/Nor PPP Imm: 81 %
Protein C Ag/FVII Ag Ratio**: 0.5 ratio
Protein S Ag/FVII Ag Ratio**: 0.4 ratio — ABNORMAL LOW

## 2017-07-13 LAB — HYPERSENSITIVITY PNEUMONITIS
A. PULLULANS ABS: NEGATIVE
A.Fumigatus #1 Abs: NEGATIVE
Micropolyspora faeni, IgG: NEGATIVE
Pigeon Serum Abs: NEGATIVE
Thermoact. Saccharii: NEGATIVE
Thermoactinomyces vulgaris, IgG: NEGATIVE

## 2017-07-13 LAB — ANCA TITERS: C-ANCA: <1:20 {titer}

## 2017-07-13 LAB — RNP ANTIBODIES

## 2017-07-13 LAB — MYOSITIS PANEL III

## 2017-07-15 LAB — MYOSITIS ASSESSR PLUS JO-1 AUTOABS
EJ Autoabs: NOT DETECTED
Jo-1 Autoabs: <1 AI
KU AUTOABS: NOT DETECTED
MI-2 AUTOABS: NOT DETECTED
OJ Autoabs: NOT DETECTED
PL-12 Autoabs: NOT DETECTED
PL-7 AUTOABS: NOT DETECTED
SRP Autoabs: NOT DETECTED

## 2017-07-15 LAB — ANA, IFA COMPREHENSIVE PANEL
ANA: NEGATIVE
DS DNA AB: 7 [IU]/mL — AB
ENA SM AB SER-ACNC: NEGATIVE AI
SM/RNP: NEGATIVE AI
SSA (RO) (ENA) ANTIBODY, IGG: NEGATIVE AI
SSB (La) (ENA) Antibody, IgG: <1 AI
Scleroderma (Scl-70) (ENA) Antibody, IgG: <1 AI

## 2017-07-15 LAB — CK TOTAL AND CKMB (NOT AT ARMC): Total CK: 34 U/L (ref 29–143)

## 2017-07-15 LAB — ANACHOICE SPEC AB CASCADING REFLEX: ANACHOICE (R) SCREEN: NEGATIVE

## 2017-07-15 LAB — CYCLIC CITRUL PEPTIDE ANTIBODY, IGG: CYCLIC CITRULLIN PEPTIDE AB: 20 U — AB

## 2017-07-15 LAB — ALDOLASE: Aldolase: 3.6 U/L (ref <=8.1)

## 2017-07-15 LAB — RHEUMATOID FACTOR

## 2017-07-17 ENCOUNTER — Other Ambulatory Visit: Payer: Self-pay | Admitting: Physician Assistant

## 2017-07-17 MED ORDER — ALBUTEROL SULFATE (2.5 MG/3ML) 0.083% IN NEBU: 2.5000 mg | INHALATION_SOLUTION | Freq: Four times a day (QID) | RESPIRATORY_TRACT | 2 refills | Status: DC | PRN

## 2017-07-19 ENCOUNTER — Ambulatory Visit (INDEPENDENT_AMBULATORY_CARE_PROVIDER_SITE_OTHER): Payer: No Typology Code available for payment source | Admitting: Pulmonary Disease

## 2017-07-19 ENCOUNTER — Telehealth: Payer: Self-pay

## 2017-07-19 ENCOUNTER — Encounter: Payer: Self-pay | Admitting: Pulmonary Disease

## 2017-07-19 VITALS — BP 112/70 | HR 86 | Ht 64.0 in | Wt 171.0 lb

## 2017-07-19 DIAGNOSIS — J849 Interstitial pulmonary disease, unspecified: Secondary | ICD-10-CM

## 2017-07-19 DIAGNOSIS — J439 Emphysema, unspecified: Secondary | ICD-10-CM

## 2017-07-19 DIAGNOSIS — M069 Rheumatoid arthritis, unspecified: Secondary | ICD-10-CM

## 2017-07-19 MED ORDER — DULERA 100-5 MCG/ACT IN AERO: 2.0000 | INHALATION_SPRAY | Freq: Two times a day (BID) | RESPIRATORY_TRACT | 2 refills | Status: DC

## 2017-07-19 NOTE — Patient Instructions (Signed)
We will send you to a rheumatologist for assessment of rheumatoid arthritis Your CT scan and lung function test shows pulmonary fibrosis and emphysema We will give you a prescription for [facility] and give instructions on how to use it Continue the albuterol as needed Follow-up 3 months in ILD clinic

## 2017-07-19 NOTE — Telephone Encounter (Signed)
Called and spoke with pt telling her the information that was found out by Synetta Fail and per Dr. Isaiah Serge for the ONO to be cancelled and for pt to wear O2 with exertion and then be reevaluated at next OV.  I stated to pt that [facility] has recommended pt apply for medicaid and pt stated she had try to apply before but they stated to her she did not qualify.  Will route this to Synetta Fail to let her know we are cancelling pt's ONO and only ordering O2 with exertion.

## 2017-07-19 NOTE — Telephone Encounter (Addendum)
Per Dr. Isaiah Serge, cancel ONO and do O2 with exertion for now. We will re evaluate at next OV. lmtcb x1 for pt.  Per Synetta Fail, we are having a difficult time finding a DME to provide pt with O2, as pt is uninsured. All DME's are requiring pt to pay out of pocket.  [facility] has recommended that pt apply for medicaid.

## 2017-07-19 NOTE — Telephone Encounter (Signed)
Patient returned phone call; pt contact # [phone]

## 2017-07-19 NOTE — Progress Notes (Signed)
Amber Lloyd    [id number]    12-10-1955  Primary Care Physician:McElroy, Willodean Rosenthal  Referring Physician: Jacquelin Hawking, PA-C [address] [place], [place] [address]  Chief complaint: Follow up for interstitial lung disease  HPI: 62 year old with history of allergies, anemia, depression, irritable bowel syndrome.  She has complaints of dyspnea on exertion for many months.  This worsened in mid December associated with dizziness and presyncope.  She is evaluated by primary care who gave her prednisone and nebulizer with mild improvement.  Still has ongoing symptoms associated with cough, yellow mucus.  Denies any fevers, chills and she had a chest x-ray in December which shows interstitial opacities and has been referred to pulmonary for further evaluation.  Family history significant for alpha-1 antitrypsin deficiency.  She had been evaluated in 2010 with MZ phenotype and normal levels.   Pets: 3 cats, 4 dogs, turtle, fish, frog.  No birds or farm animals Occupation: Facilities manager for patients with dementia Exposures: Has leaky basement, possible mold Smoking history: 30-pack-year smoking history.  Quit in 2009 Travel History: Not significant  Interim history: Amber Lloyd is here for review of CT scan and pulmonary function test.  She states that the dyspnea on exertion is unchanged.  She still has some dizziness while exerting herself.  Outpatient Encounter Medications as of 07/19/2017  Medication Sig  . albuterol (PROVENTIL HFA;VENTOLIN HFA) 108 (90 Base) MCG/ACT inhaler Inhale 2 puffs into the lungs every 6 (six) hours as needed for wheezing or shortness of breath.  Marland Kitchen albuterol (PROVENTIL) (2.5 MG/3ML) 0.083% nebulizer solution Take 3 mLs (2.5 mg total) by nebulization every 6 (six) hours as needed for wheezing or shortness of breath.  . DULERA 100-5 MCG/ACT AERO 2 puffs 2 (two) times daily.   . Ibuprofen (ADVIL PO) Take 1 tablet by mouth daily as needed. For  shoulder pain and headaches   . IRON PO Take 1 tablet by mouth daily.  Marland Kitchen OMEPRAZOLE PO Take 1 tablet by mouth daily as needed.   . sertraline (ZOLOFT) 50 MG tablet Take 100 mg by mouth daily.    No facility-administered encounter medications on file as of 07/19/2017.     Allergies as of 07/19/2017  . (No Known Allergies)    Past Medical History:  Diagnosis Date  . Anemia   . Anxiety   . Depression   . GERD (gastroesophageal reflux disease)   . IBS (irritable bowel syndrome)   . ILD (interstitial lung disease) (HCC)     Past Surgical History:  Procedure Laterality Date  . ABDOMINAL HYSTERECTOMY    . PARTIAL HYSTERECTOMY Right 1983  . TUBAL LIGATION  1981    Family History  Problem Relation Age of Onset  . Alzheimer's disease Mother   . Anemia Mother   . Anemia Sister   . Anemia Daughter   . Anemia Sister     Social History   Socioeconomic History  . Marital status: Married    Spouse name: Not on file  . Number of children: Not on file  . Years of education: Not on file  . Highest education level: Not on file  Social Needs  . Financial resource strain: Not on file  . Food insecurity - worry: Not on file  . Food insecurity - inability: Not on file  . Transportation needs - medical: Not on file  . Transportation needs - non-medical: Not on file  Occupational History  . Not on file  Tobacco Use  . Smoking status: Former Smoker    Packs/day: 1.00    Years: 34.00    Pack years: 34.00    Types: Cigarettes    Last attempt to quit: 07/27/2007    Years since quitting: 9.9  . Smokeless tobacco: Never Used  Substance and Sexual Activity  . Alcohol use: No  . Drug use: No  . Sexual activity: No    Birth control/protection: None  Other Topics Concern  . Not on file  Social History Narrative  . Not on file    Review of systems: Review of Systems  Constitutional: Negative for fever and chills.  HENT: Negative.   Eyes: Negative for blurred vision.    Respiratory: as per HPI  Cardiovascular: Negative for chest pain and palpitations.  Gastrointestinal: Negative for vomiting, diarrhea, blood per rectum. Genitourinary: Negative for dysuria, urgency, frequency and hematuria.  Musculoskeletal: Negative for myalgias, back pain and joint pain.  Skin: Negative for itching and rash.  Neurological: Negative for dizziness, tremors, focal weakness, seizures and loss of consciousness.  Endo/Heme/Allergies: Negative for environmental allergies.  Psychiatric/Behavioral: Negative for depression, suicidal ideas and hallucinations.  All other systems reviewed and are negative.  Physical Exam: Blood pressure 112/70, pulse 86, height 5\' 4"  (1.626 m), weight 171 lb (77.6 kg), SpO2 94 %. Gen:      No acute distress HEENT:  EOMI, sclera anicteric Neck:     No masses; no thyromegaly Lungs:    Basal crackles; normal respiratory effort CV:         Regular rate and rhythm; no murmurs Abd:      + bowel sounds; soft, non-tender; no palpable masses, no distension Ext:    No edema; adequate peripheral perfusion Skin:      Warm and dry; no rash Neuro: alert and oriented x 3 Psych: normal mood and affect  Data Reviewed: Imaging Chest x-ray 03/01/08-  Clear lungs, no acute cardiopulmonary disease. Chest x-ray 05/13/17-bilateral interstitial opacities, more at the bases.  New from 2009. High-resolution CT chest 06/25/17-reticular opacities, bronchiolectasis with basal gradient.  No honeycombing.  Moderate centrilobular and paraseptal emphysema. Reviewed the images personally  PFTs 06/25/17 FVC 2.37 [33%], FEV1 1.97 [5 9%], F/F 83, TLC 75%, DLCO 20% Mild restriction with severe diffusion defect.  Labs Alpha-1 antitrypsin 02/16/09-PIMZ, 114 mg/dL 02/18/09- 9/93/57 mg/dL  CBC 017 6.7, eosinophils 6%, absolute eosinophil count 400  Connective tissue serologies 07/05/17-ANA, ANCA, Ro, La, scl 70 negative, aldolase 3.6 CCP 20, double-stranded DNA 7,  anticardiolipin antibody 49 Rheumatoid factor < 14  Assessment:  Pulmonary fibrosis CT scan reviewed which shows fibrosis in probable UIP pattern.  There is no honeycombing She does not have any significant exposures except for possible mold in basement.   Serologies are positive for borderline elevation CCP, double-stranded DNA and anticardiolipin antibody  May be IPF or connective tissue disease as endorses some joint pain, stiffness in the wrist and hands.  I will send her to rheumatology for evaluation.  If there is no clinical signs of connective tissue disease then we can consider anti-fibrotic therapy.  Hypersensitivity panel ordered at last visit due to mold exposure but was not done.  I would like to repeat this, however she is dealing with test costs from the last round of testing as she is uninsured.  We will defer this testing for now  She has desaturations with exertion.  Start 2 L oxygen with exertion.  Emphysema Family history of alpha-1 antitrypsin deficiency MZ phenotype  CT scan shows moderate emphysema with no obstruction on PFTs.  She has documented MZ phenotype however alpha-1 antitrypsin levels are normal. There is no indication at present for replacement therapy Start Dulera inhaler, continue albuterol as needed.  More then 1/2 the time of the 40 min visit was spent in counseling and/or coordination of care with the patient and family.  Plan/Recommendations: - Rheumatology eval - Start 2 L oxygen with exertion - Start Dulera inhaler, continue albuterol  Chilton Greathouse MD [facility] Pager [phone] 07/19/2017, 11:53 AM  CC: Jacquelin Hawking, PA-C

## 2017-07-19 NOTE — Telephone Encounter (Signed)
I have faxed the 02 order to [facility] they stated they charged $55.00 a month for 02 and they will contact the patient

## 2017-07-29 ENCOUNTER — Telehealth: Payer: Self-pay | Admitting: Pulmonary Disease

## 2017-07-29 DIAGNOSIS — J439 Emphysema, unspecified: Secondary | ICD-10-CM

## 2017-07-29 NOTE — Telephone Encounter (Signed)
Called and spoke to pt. Pt is requesting that O2 order be switched from [facility] to [facility]. I have placed O2 order to [facility] medical supply.  Nothing further is needed.

## 2017-08-01 ENCOUNTER — Telehealth: Payer: Self-pay | Admitting: Pulmonary Disease

## 2017-08-01 NOTE — Telephone Encounter (Signed)
Fax confirmation received. 

## 2017-08-01 NOTE — Telephone Encounter (Signed)
Called and spoke to pt. Pt states she called [facility] and was informed they did not receive the order for pt's O2. Per the referral note the order was faxed and a confirmation was received by [place].   PCC's please advise if this can be re-sent to DME. Fax number is [phone].

## 2017-08-01 NOTE — Telephone Encounter (Signed)
I have faxed this order again

## 2017-08-08 ENCOUNTER — Telehealth: Payer: Self-pay | Admitting: Pulmonary Disease

## 2017-08-08 NOTE — Telephone Encounter (Signed)
Type Date User Summary Attachment  General 08/08/2017 9:07 AM Donnamae Jude - -  Note   Response from Dr Isaiah Serge -  Try Dr. Deanne Coffer at [facility] medical associates   -I called & they do not participate with [facility] Financial Assistance Progam.  I sent message to Dr Isaiah Serge to make him aware.        Type Date User Summary Attachment  General 08/07/2017 10:39 AM Donnamae Jude - -  Note   I sent staff message to PM/Margie today - You had put in referral on 2/22 for pt to see Rheumatology within [facility].  I called [facility] & they do not accept [facility] discount program.  I called Dr Fatima Sanger office & they do accept discount but Dr Corliss Skains only accepts some new patients.  Referral has to be sent to her & she will decide if she will take the patient.  I sent referral to her on 2/25 & was informed today that she declined to see the patient.  I don't know where else I can send the patient.         Type Date User Summary Attachment  General 07/26/2017 4:00 PM Beavers, April J Auto: Referral message -  Note   ----- Message ----- FromLianne Cure Sent: 07/26/2017   3:59 PM To: Tana Felts, RN  Referral declined per Dr. Corliss Skains.        Type Date User Summary Attachment  General 07/22/2017 4:01 PM Donnamae Jude - -  Note   Confirmation received from fax sent to Dr Fatima Sanger office.  I will follow up in a couple of weeks & see if they were able to see pt.        Type Date User Summary Attachment  General 07/22/2017 3:43 PM Donnamae Jude - -  Note   I verified with Lyla Son in billing for [facility] that they do participate with [employer].  Will fax records over to Dr Fatima Sanger office to see if she will take pt.  Left pt a vm to make her aware I will be sending records to dr Fatima Sanger office & she determines if she will take on a new pt or not.  Gave pt my phone # to call me back if she has any  questions.  Faxed referral/records to Dr Fatima Sanger office.        Type Date User Summary Attachment  General 07/22/2017 9:22 AM Donnamae Jude - -  Note   [facility] does not participate with [facility] Policy.  Still waiting to hear back from [facility].        Type Date User Summary Attachment  General 07/19/2017 1:47 PM Donnamae Jude - -  Note   [facility] closed at noon.  Called [facility] & left vm for billing to call me back & let me know if participates in [facility].        Type Date User Summary Attachment  Provider Comments 07/19/2017 12:23 PM Tana Felts, RN Provider Comments -  Note   Rheumatologist (within [facility]) for rheumatoid arthrisit         Left message for patient to call back.

## 2017-08-08 NOTE — Telephone Encounter (Signed)
Pt is calling back [phone]

## 2017-08-08 NOTE — Telephone Encounter (Signed)
Spoke with pt. Made her aware that we are still trying to work on this referral for her. She is aware of all of the documentation on the referral. Advised her that as soon as we get this figured out we will let her know. She was appreciative of all of our help.

## 2017-08-20 ENCOUNTER — Telehealth: Payer: Self-pay | Admitting: Pulmonary Disease

## 2017-08-20 NOTE — Telephone Encounter (Signed)
Pt is aware of below message and voiced her understanding. Pt is requesting a letter to give to social services explaining her lung condition, as she has been denied for medicaid.    Dr. Isaiah Serge please advise on letter. Thanks

## 2017-08-20 NOTE — Telephone Encounter (Signed)
Spoke with pt. She is questioning her Rheumatology referral. We have still not been able to find an office that will take her on as a pt. Pt has the orange card from [facility]. She is wondering where she goes from here.  Dr. Isaiah Serge - please advise. Thanks.

## 2017-08-20 NOTE — Telephone Encounter (Signed)
We are checking with medical liaison to see who will accept the referral and will let her know.

## 2017-08-21 NOTE — Telephone Encounter (Signed)
OK to send letter stating she has emphysema and interstitial lung disease with severe reduction in lung function.

## 2017-08-22 NOTE — Telephone Encounter (Signed)
Called and spoke with pt letting her know Dr. Isaiah Serge had said it was fine for [place] to write a letter so she can take it to social services.  Pt exprssed understanding and I asked pt if she wanted [place] to mail it to her or if she wanted to come by the office to pick it up.  Pt stated to me to mail it to her address, and I verified address we have on file for pt to make sure it was the correct address.  Letter has been typed and placed up front to be mailed to pt.

## 2017-08-23 NOTE — Telephone Encounter (Signed)
lm for community health and wellness refferal coordinator, requesting recommendations on Rheumatologist that accepts gold card.  Community health and wellness contact number: [phone]

## 2017-08-28 ENCOUNTER — Telehealth: Payer: Self-pay | Admitting: Pulmonary Disease

## 2017-08-28 DIAGNOSIS — M069 Rheumatoid arthritis, unspecified: Secondary | ICD-10-CM

## 2017-08-28 NOTE — Telephone Encounter (Addendum)
Lm x2 for Amber Lloyd with community health and wellness.

## 2017-08-28 NOTE — Telephone Encounter (Signed)
Spoke with Arna Medici at [facility], states that the only rheumatologist that accepts [facility] orange card is Dr. Corliss Skains, who has already declined to see patient.  Arna Medici states that the only other options for the pt is to either: 1. Apply for financial aid through [facility] and be seen by their rheumatology department, or 2. Can pay out of pocket to be seen by [facility] in [facility] for a set cost of $145/visit.   Dr. Isaiah Serge please advise on how to proceed.  Thanks!

## 2017-08-29 NOTE — Telephone Encounter (Signed)
See message from 4.3.2019. Will sign off.

## 2017-08-29 NOTE — Telephone Encounter (Signed)
Spoke to patient and relayed below message. Pt states she will research both options.  Pt has been provided with both [facility] and [facility] contact number.   Dr. Isaiah Serge, your ILD clinic is full on 09/17/17, however you have slots available on 09/24/17. Please advise if okay to schedule on 4/30. Thanks

## 2017-08-29 NOTE — Telephone Encounter (Signed)
OK to schedule for 4/30

## 2017-08-29 NOTE — Telephone Encounter (Signed)
Pt has been scheduled for 09/24/17 at 2:00. Pt is aware and voiced her understanding.  Pt states she is still researching rheumatology.  I have advised pt to keep [place] updated, so a referral can be placed.

## 2017-08-29 NOTE — Telephone Encounter (Signed)
Please inform the patient and help her apply for financial aid. Does she have a follow up appointment with me? Please make appt for the next available with me at ILD clinic  Amber Greathouse MD [facility] 08/29/2017, 8:55 AM

## 2017-08-30 NOTE — Telephone Encounter (Signed)
lmtcb x1 for pt. 

## 2017-08-30 NOTE — Telephone Encounter (Signed)
Pt is returning call. Per Pt, she has found a Rheumatology through [facility]. Pt does not know the name of the specific doctor. Per Pt fax number is [phone], Phone number [phone]. Pt cb is [phone].

## 2017-09-02 NOTE — Telephone Encounter (Signed)
Called and spoke with patient. Patient would like referral to [facility]. Will place order for referral and will route to [facility] as FYI.

## 2017-09-04 ENCOUNTER — Telehealth: Payer: Self-pay | Admitting: Pulmonary Disease

## 2017-09-04 NOTE — Telephone Encounter (Signed)
Pt returning call. Advised pt that Rheumatology has received her referral and they will be contacting her soon to make an apt.

## 2017-09-04 NOTE — Telephone Encounter (Signed)
Noted. Will close this encounter.  

## 2017-09-04 NOTE — Telephone Encounter (Signed)
Spoke with patient. She stated that [facility] has not received the referral that sent over on 09/02/17.   Spoke with Brayton Caves at [facility]. She stated that they have received the referral, it was reviewed by one of the doctors and they will be calling her soon to schedule an appointment.   Left message for patient to call back.

## 2017-09-17 ENCOUNTER — Telehealth: Payer: Self-pay | Admitting: Pulmonary Disease

## 2017-09-17 ENCOUNTER — Ambulatory Visit: Payer: Self-pay | Admitting: Physician Assistant

## 2017-09-17 NOTE — Telephone Encounter (Signed)
Called and spoke to Dr. Allena Katz and let him know that Dr. Isaiah Serge is out sick and we aren't sure when he will return. Dr. Allena Katz stated it is not urgent and he can wait for Dr. Isaiah Serge to return.   Routing to Dr. Isaiah Serge to return Dr. Eliane Decree phone call.

## 2017-09-19 NOTE — Telephone Encounter (Signed)
Dr. Isaiah Serge just following up and making sure you get this message since you are back in the office. Thank you!

## 2017-09-23 NOTE — Telephone Encounter (Signed)
I called last week and left a voice mail. I am waiting to hear from him. Keep the encounter open till I can talk to Dr. Allena Katz

## 2017-09-23 NOTE — Telephone Encounter (Signed)
Dr Isaiah Serge please advise if this encounter can be closed.  Thanks!

## 2017-09-24 ENCOUNTER — Encounter: Payer: Self-pay | Admitting: Pulmonary Disease

## 2017-09-24 ENCOUNTER — Ambulatory Visit (INDEPENDENT_AMBULATORY_CARE_PROVIDER_SITE_OTHER): Payer: No Typology Code available for payment source | Admitting: Pulmonary Disease

## 2017-09-24 ENCOUNTER — Telehealth: Payer: Self-pay | Admitting: Pulmonary Disease

## 2017-09-24 VITALS — BP 116/70 | HR 88 | Ht 64.0 in | Wt 168.0 lb

## 2017-09-24 DIAGNOSIS — Z148 Genetic carrier of other disease: Secondary | ICD-10-CM | POA: Insufficient documentation

## 2017-09-24 DIAGNOSIS — J849 Interstitial pulmonary disease, unspecified: Secondary | ICD-10-CM

## 2017-09-24 DIAGNOSIS — J439 Emphysema, unspecified: Secondary | ICD-10-CM

## 2017-09-24 DIAGNOSIS — E8801 Alpha-1-antitrypsin deficiency: Secondary | ICD-10-CM

## 2017-09-24 NOTE — Progress Notes (Signed)
Amber Lloyd    [id number]    March 08, 1956  Primary Care Physician:McElroy, Willodean Rosenthal  Referring Physician: Jacquelin Hawking, PA-C [address] [place], [place] [address]  Chief complaint: Follow up for interstitial lung disease  HPI: 62 year old with history of allergies, anemia, depression, irritable bowel syndrome.  She has complaints of dyspnea on exertion for many months.  This worsened in mid December associated with dizziness and presyncope.  She is evaluated by primary care who gave her prednisone and nebulizer with mild improvement.  Still has ongoing symptoms associated with cough, yellow mucus.  Denies any fevers, chills and she had a chest x-ray in December which shows interstitial opacities and has been referred to pulmonary for further evaluation.  Family history significant for alpha-1 antitrypsin deficiency.  She had been evaluated in 2010 with MZ phenotype and normal levels.   Pets: 3 cats, 4 dogs, turtle, fish, frog.  No birds or farm animals Occupation: Facilities manager for patients with dementia Exposures: Has leaky basement, possible mold.  She was employed in a [employer] for 3 years in 1970s and was exposed to fiber at that time. Smoking history: 30-pack-year smoking history.  Quit in 2009 Travel History: Not significant  Interim history: Started on supplemental oxygen.  States that her breathing is stable with dyspnea on exertion. Complains of fatigue.  Denies any cough, sputum, fevers, chills  She has been seen by Dr. Allena Katz, rheumatology at [facility] who feels there is no evidence of rheumatoid arthritis.  Outpatient Encounter Medications as of 09/24/2017  Medication Sig  . albuterol (PROVENTIL HFA;VENTOLIN HFA) 108 (90 Base) MCG/ACT inhaler Inhale 2 puffs into the lungs every 6 (six) hours as needed for wheezing or shortness of breath.  Marland Kitchen albuterol (PROVENTIL) (2.5 MG/3ML) 0.083% nebulizer solution Take 3 mLs (2.5 mg total) by nebulization every 6  (six) hours as needed for wheezing or shortness of breath.  . DULERA 100-5 MCG/ACT AERO Inhale 2 puffs into the lungs 2 (two) times daily.  . Ibuprofen (ADVIL PO) Take 1 tablet by mouth daily as needed. For shoulder pain and headaches   . IRON PO Take 1 tablet by mouth daily.  Marland Kitchen OMEPRAZOLE PO Take 1 tablet by mouth daily as needed.   . sertraline (ZOLOFT) 50 MG tablet Take 100 mg by mouth daily.    No facility-administered encounter medications on file as of 09/24/2017.     Allergies as of 09/24/2017  . (No Known Allergies)    Past Medical History:  Diagnosis Date  . Anemia   . Anxiety   . Depression   . GERD (gastroesophageal reflux disease)   . IBS (irritable bowel syndrome)   . ILD (interstitial lung disease) (HCC)     Past Surgical History:  Procedure Laterality Date  . ABDOMINAL HYSTERECTOMY    . PARTIAL HYSTERECTOMY Right 1983  . TUBAL LIGATION  1981    Family History  Problem Relation Age of Onset  . Alzheimer's disease Mother   . Anemia Mother   . Anemia Sister   . Anemia Daughter   . Anemia Sister     Social History   Socioeconomic History  . Marital status: Married    Spouse name: Not on file  . Number of children: Not on file  . Years of education: Not on file  . Highest education level: Not on file  Occupational History  . Not on file  Social Needs  . Financial resource strain: Not on file  .  Food insecurity:    Worry: Not on file    Inability: Not on file  . Transportation needs:    Medical: Not on file    Non-medical: Not on file  Tobacco Use  . Smoking status: Former Smoker    Packs/day: 1.00    Years: 34.00    Pack years: 34.00    Types: Cigarettes    Last attempt to quit: 07/27/2007    Years since quitting: 10.1  . Smokeless tobacco: Never Used  Substance and Sexual Activity  . Alcohol use: No  . Drug use: No  . Sexual activity: Never    Birth control/protection: None  Lifestyle  . Physical activity:    Days per week: Not on file     Minutes per session: Not on file  . Stress: Not on file  Relationships  . Social connections:    Talks on phone: Not on file    Gets together: Not on file    Attends religious service: Not on file    Active member of club or organization: Not on file    Attends meetings of clubs or organizations: Not on file    Relationship status: Not on file  . Intimate partner violence:    Fear of current or ex partner: Not on file    Emotionally abused: Not on file    Physically abused: Not on file    Forced sexual activity: Not on file  Other Topics Concern  . Not on file  Social History Narrative  . Not on file    Review of systems: Review of Systems  Constitutional: Negative for fever and chills.  HENT: Negative.   Eyes: Negative for blurred vision.  Respiratory: as per HPI  Cardiovascular: Negative for chest pain and palpitations.  Gastrointestinal: Negative for vomiting, diarrhea, blood per rectum. Genitourinary: Negative for dysuria, urgency, frequency and hematuria.  Musculoskeletal: Negative for myalgias, back pain and joint pain.  Skin: Negative for itching and rash.  Neurological: Negative for dizziness, tremors, focal weakness, seizures and loss of consciousness.  Endo/Heme/Allergies: Negative for environmental allergies.  Psychiatric/Behavioral: Negative for depression, suicidal ideas and hallucinations.  All other systems reviewed and are negative.  Physical Exam: Blood pressure 112/70, pulse 86, height 5\' 4"  (1.626 m), weight 171 lb (77.6 kg), SpO2 94 %. Gen:      No acute distress HEENT:  EOMI, sclera anicteric Neck:     No masses; no thyromegaly Lungs:    Basal crackles; normal respiratory effort CV:         Regular rate and rhythm; no murmurs Abd:      + bowel sounds; soft, non-tender; no palpable masses, no distension Ext:    No edema; adequate peripheral perfusion Skin:      Warm and dry; no rash Neuro: alert and oriented x 3 Psych: normal mood and  affect  Data Reviewed: Imaging Chest x-ray 03/01/08-  Clear lungs, no acute cardiopulmonary disease. Chest x-ray 05/13/17-bilateral interstitial opacities, more at the bases.  New from 2009. High-resolution CT chest 06/25/17-reticular opacities, bronchiolectasis with basal gradient.  No honeycombing.  Moderate centrilobular and paraseptal emphysema. Reviewed the images personally  PFTs 06/25/17 FVC 2.37 [33%], FEV1 1.97 [5 9%], F/F 83, TLC 75%, DLCO 20% Mild restriction with severe diffusion defect.  Labs Alpha-1 antitrypsin 02/16/09-PIMZ, 114 mg/dL 02/18/09- 7/68/11 mg/dL  CBC 572 6.7, eosinophils 6%, absolute eosinophil count 400  Connective tissue serologies 07/05/17-ANA, ANCA, Ro, La, scl 70 negative, aldolase 3.6 CCP 20, double-stranded DNA 7,  anticardiolipin antibody 49 Rheumatoid factor < 14 Hypersensitivity panel-negative  Assessment:  Pulmonary fibrosis, probable IPF CT scan reviewed which shows fibrosis in probable UIP pattern.  There is no honeycombing She does not have any significant exposures except for possible mold in basement.  Hypersensitivity panel is negative Serologies are positive for borderline elevation CCP, double-stranded DNA and anticardiolipin antibody.  No evidence of connective tissue disease per rheumatology consult.  It is possible that the ILD is the first manifestation of connective tissue disease.  We will continue to monitor this closely with the help of rheumatology. For now we will treat this as IPF and initiate anti-fibrotics.  Discussed progression of disease and various treatment options. We have decided to start paperwork for ofev.  She has history of piebaldism and tends to sunburn easily, hence esbriet is not a good candidate.   Check LFTs and 6 MW She will benefit from pulmonary rehab program and will make a referral to the rehab program at [place] which is closer to home.   Emphysema Family history of alpha-1 antitrypsin  deficiency MZ phenotype  She has moderate emphysema on CT scan with no obstruction on PFTs. A1AT levels are normal.  There is no indication at present for replacement therapy. Continue Dulera inhaler, continue albuterol as needed. Continue supplemental O2  GERD She has significant issues with GERD, acid reflux. Reports food gets stuck at times while swallowing.  Continue on omeprazole 40 mg once daily. Revaluate at next visit and consider GI eval with EGD, manometery.   More then 1/2 the time of the 40 min visit was spent in counseling and/or coordination of care with the patient and family.  Plan/Recommendations: - Start paper work for [employer]  - 6 MW test - Continue Dulera inhaler, continue albuterol, supplemental O2, omeprazole - Pulmonary rehab  Chilton Greathouse MD [facility] Pager [phone] 09/24/2017, 1:58 PM  CC: Jacquelin Hawking, PA-C

## 2017-09-24 NOTE — Patient Instructions (Addendum)
We will check hepatic function panel today, schedule you for a 6-minute walk test. Refer you to pulmonary rehab at [facility] We will start paperwork for ofev for treatment of lung fibrosis. Follow up in 2 months

## 2017-09-24 NOTE — Telephone Encounter (Signed)
OFEV process started and faxed to [facility]. Will leave encounter opened to f/u on.

## 2017-09-25 ENCOUNTER — Ambulatory Visit: Payer: Self-pay | Admitting: Physician Assistant

## 2017-09-25 ENCOUNTER — Encounter: Payer: Self-pay | Admitting: Physician Assistant

## 2017-09-25 VITALS — BP 92/61 | HR 72 | Temp 97.9°F | Ht 64.0 in | Wt 168.0 lb

## 2017-09-25 DIAGNOSIS — R42 Dizziness and giddiness: Secondary | ICD-10-CM

## 2017-09-25 DIAGNOSIS — Z13220 Encounter for screening for lipoid disorders: Secondary | ICD-10-CM

## 2017-09-25 DIAGNOSIS — K219 Gastro-esophageal reflux disease without esophagitis: Secondary | ICD-10-CM

## 2017-09-25 DIAGNOSIS — J849 Interstitial pulmonary disease, unspecified: Secondary | ICD-10-CM

## 2017-09-25 MED ORDER — OMEPRAZOLE 40 MG PO CPDR: 40.0000 mg | DELAYED_RELEASE_CAPSULE | Freq: Every day | ORAL | 3 refills | Status: DC

## 2017-09-25 NOTE — Progress Notes (Signed)
BP 92/61 (BP Location: Right Arm, Patient Position: Sitting, Cuff Size: Normal)   Pulse 72   Temp 97.9 F (36.6 C)   Ht 5\' 4"  (1.626 m)   Wt 168 lb (76.2 kg)   SpO2 96%   BMI 28.84 kg/m    Subjective:    Patient ID: Amber Lloyd, female    DOB: 01/27/1956, 62 y.o.   MRN: [id number]  HPI: Amber Lloyd is a 62 y.o. female presenting on 09/25/2017 for Follow-up   HPI   Pt has been to rheumatologist and pulmonologist.  She says the pulmonologist is trying to get her on OFEV.  Pt says she has applied for disability.    She is also seeing a counselor for depression and stress.  Pt states gets dizzy when she looks upwards.  No LOC.   Relevant past medical, surgical, family and social history reviewed and updated as indicated. Interim medical history since our last visit reviewed. Allergies and medications reviewed and updated.   Current Outpatient Medications:  .  albuterol (PROVENTIL HFA;VENTOLIN HFA) 108 (90 Base) MCG/ACT inhaler, Inhale 2 puffs into the lungs every 6 (six) hours as needed for wheezing or shortness of breath., Disp: 3 Inhaler, Rfl: 1 .  albuterol (PROVENTIL) (2.5 MG/3ML) 0.083% nebulizer solution, Take 3 mLs (2.5 mg total) by nebulization every 6 (six) hours as needed for wheezing or shortness of breath., Disp: 75 mL, Rfl: 2 .  DULERA 100-5 MCG/ACT AERO, Inhale 2 puffs into the lungs 2 (two) times daily., Disp: 3 Inhaler, Rfl: 2 .  Ibuprofen (ADVIL PO), Take 1 tablet by mouth daily as needed. For shoulder pain and headaches , Disp: , Rfl:  .  IRON PO, Take 1 tablet by mouth daily., Disp: , Rfl:  .  OMEPRAZOLE PO, Take 1 tablet by mouth daily as needed. , Disp: , Rfl:  .  OXYGEN, Inhale into the lungs., Disp: , Rfl:  .  sertraline (ZOLOFT) 50 MG tablet, Take 100 mg by mouth daily. , Disp: , Rfl:    Review of Systems  Constitutional: Positive for fatigue. Negative for appetite change, chills, diaphoresis, fever and unexpected weight change.  HENT: Positive  for congestion, sneezing and voice change. Negative for drooling, ear pain, facial swelling, hearing loss, mouth sores, sore throat and trouble swallowing.   Eyes: Positive for itching. Negative for pain, discharge, redness and visual disturbance.  Respiratory: Positive for cough, shortness of breath and wheezing. Negative for choking.   Cardiovascular: Negative for chest pain, palpitations and leg swelling.  Gastrointestinal: Negative for abdominal pain, blood in stool, constipation, diarrhea and vomiting.  Endocrine: Positive for cold intolerance. Negative for heat intolerance and polydipsia.  Genitourinary: Negative for decreased urine volume, dysuria and hematuria.  Musculoskeletal: Positive for arthralgias. Negative for back pain and gait problem.  Skin: Negative for rash.  Allergic/Immunologic: Positive for environmental allergies.  Neurological: Positive for light-headedness and headaches. Negative for seizures and syncope.  Hematological: Negative for adenopathy.  Psychiatric/Behavioral: Negative for agitation, dysphoric mood and suicidal ideas. The patient is nervous/anxious.     Per HPI unless specifically indicated above     Objective:    BP 92/61 (BP Location: Right Arm, Patient Position: Sitting, Cuff Size: Normal)   Pulse 72   Temp 97.9 F (36.6 C)   Ht 5\' 4"  (1.626 m)   Wt 168 lb (76.2 kg)   SpO2 96%   BMI 28.84 kg/m   Wt Readings from Last 3 Encounters:  09/25/17 168 lb (76.2 kg)  09/24/17 168 lb (76.2 kg)  07/19/17 171 lb (77.6 kg)    Physical Exam  Constitutional: She is oriented to person, place, and time. She appears well-developed and well-nourished.  HENT:  Head: Normocephalic and atraumatic.  Neck: Neck supple.  Cardiovascular: Normal rate and regular rhythm.  No carotid bruits heard  Pulmonary/Chest: Effort normal and breath sounds normal.  Abdominal: Soft. Bowel sounds are normal. She exhibits no mass. There is no hepatosplenomegaly. There is no  tenderness.  Musculoskeletal: She exhibits no edema.  Lymphadenopathy:    She has no cervical adenopathy.  Neurological: She is alert and oriented to person, place, and time.  Skin: Skin is warm and dry.  Psychiatric: She has a normal mood and affect. Her behavior is normal.  Vitals reviewed.       Assessment & Plan:    Encounter Diagnoses  Name Primary?  . Dizziness after extension of neck Yes  . Screening cholesterol level   . ILD (interstitial lung disease) (HCC)   . Gastroesophageal reflux disease, esophagitis presence not specified     -ordered Carotid doppler to evaluate dizziness with neck extension -She has [facility] -encouraged pt to apply for medicaid -Check lipids -Ordered omeprazole from [facility] -pt to Follow up 3 months.  RTO sooenr prn

## 2017-09-26 ENCOUNTER — Other Ambulatory Visit (HOSPITAL_COMMUNITY)
Admission: RE | Admit: 2017-09-26 | Discharge: 2017-09-26 | Disposition: A | Discharge status: Home / Self Care | Payer: No Typology Code available for payment source | Source: Ambulatory Visit | Attending: Pulmonary Disease | Admitting: Pulmonary Disease

## 2017-09-26 ENCOUNTER — Other Ambulatory Visit (HOSPITAL_COMMUNITY)
Admission: RE | Admit: 2017-09-26 | Discharge: 2017-09-26 | Disposition: A | Discharge status: Home / Self Care | Payer: No Typology Code available for payment source | Source: Ambulatory Visit | Attending: Physician Assistant | Admitting: Physician Assistant

## 2017-09-26 DIAGNOSIS — R42 Dizziness and giddiness: Secondary | ICD-10-CM

## 2017-09-26 DIAGNOSIS — J849 Interstitial pulmonary disease, unspecified: Secondary | ICD-10-CM | POA: Insufficient documentation

## 2017-09-26 DIAGNOSIS — Z13220 Encounter for screening for lipoid disorders: Secondary | ICD-10-CM

## 2017-09-26 LAB — HEPATIC FUNCTION PANEL
ALK PHOS: 77 U/L (ref 38–126)
ALT: 14 U/L (ref 14–54)
AST: 20 U/L (ref 15–41)
Albumin: 3.7 g/dL (ref 3.5–5.0)
BILIRUBIN DIRECT: 0.1 mg/dL (ref 0.1–0.5)
BILIRUBIN INDIRECT: 0.9 mg/dL (ref 0.3–0.9)
BILIRUBIN TOTAL: 1 mg/dL (ref 0.3–1.2)
Total Protein: 7.2 g/dL (ref 6.5–8.1)

## 2017-09-26 LAB — LIPID PANEL
CHOL/HDL RATIO: 2.7 ratio
Cholesterol: 205 mg/dL — ABNORMAL HIGH (ref 0–200)
HDL: 77 mg/dL (ref >40)
LDL Cholesterol: 113 mg/dL — ABNORMAL HIGH (ref 0–99)
TRIGLYCERIDES: 74 mg/dL (ref <150)
VLDL: 15 mg/dL (ref 0–40)

## 2017-09-30 ENCOUNTER — Ambulatory Visit (HOSPITAL_COMMUNITY)
Admission: RE | Admit: 2017-09-30 | Discharge: 2017-09-30 | Disposition: A | Discharge status: Home / Self Care | Payer: No Typology Code available for payment source | Source: Ambulatory Visit | Attending: Physician Assistant | Admitting: Physician Assistant

## 2017-09-30 DIAGNOSIS — I6523 Occlusion and stenosis of bilateral carotid arteries: Secondary | ICD-10-CM | POA: Insufficient documentation

## 2017-09-30 DIAGNOSIS — R42 Dizziness and giddiness: Secondary | ICD-10-CM | POA: Insufficient documentation

## 2017-09-30 NOTE — Telephone Encounter (Signed)
Pt is aware of below message and voiced her understanding. Pt states she will arrange transportation and will call before coming to sign forms.  Forms are in Dr. Shirlee More cubby currently, as a signature is needed from him as well.  Forms will be placed up front, once receiving call from pt.

## 2017-09-30 NOTE — Telephone Encounter (Signed)
Called and spoke with patient, she states that the last message was a misunderstanding. She states that Vallarie Mare will pick up the papers tomorrow to take to the patient to fill out then will bring them back.

## 2017-09-30 NOTE — Telephone Encounter (Signed)
Pt is calling back [phone] 

## 2017-09-30 NOTE — Telephone Encounter (Signed)
Patient states Amber Lloyd from [employer] will pick up forms tomorrow, 05/07 around noon. Patient asks that we have these up front for her to pick up.  CB [phone]

## 2017-09-30 NOTE — Telephone Encounter (Signed)
Pt wants to know if the paper work  financial aid application be faxed over to her son and pt would fax it back to PM.

## 2017-09-30 NOTE — Telephone Encounter (Addendum)
Called [facility] and spoke with Noreene Larsson to check on status of pt's application.  Per Noreene Larsson, pt was referred to pt assistance program through [employer] due to pt not having any insurance.  Called [employer] at [phone] and spoke with Kennon Rounds to check on status of pt's application.  Per Kennon Rounds, they did not have any information regarding pt. When I stated to Kennon Rounds that [facility] stated they had sent all pt's information to them, Kennon Rounds looked again and told me she did not have anything on pt.   Kennon Rounds stated to me she was going to fax paperwork that needed to be filled out and signed by both pt and MD.  Daun Peacock aware of this that needed to be done.    Attempted to call pt to let her know we need to have her come sign a [employer] enrollment form but no answer. Left message for pt to return call.   Will route to [facility] for her to f/u on.

## 2017-10-01 NOTE — Telephone Encounter (Signed)
Called and spoke to pt.  It appears that [employer] emailed her the needed forms.  Pt's friend, Rosey Bath brought forms by our office today. It looks like all forms have been signed by pt at this time. .  Will fax in all signed forms to [employer] on 10/02/17, as Dr. Shirlee More signature is needed.

## 2017-10-01 NOTE — Telephone Encounter (Signed)
Dr. Isaiah Serge please advise if you have spoke with Dr. Allena Katz. Thanks!

## 2017-10-01 NOTE — Telephone Encounter (Signed)
Pt is calling about financial aid paperwork [phone]

## 2017-10-01 NOTE — Telephone Encounter (Signed)
I have left a couple of messages but did not get a call back. We can close this message as I have reviewed his office note.

## 2017-10-01 NOTE — Telephone Encounter (Signed)
Spoke with pt. She is very confused about her paperwork. I attempted to answer her questions but I could not answer all of her questions. Pt would like to speak to [facility].

## 2017-10-01 NOTE — Telephone Encounter (Signed)
Pt Social Worker dropped off completed paperwork. Placed in PM's folder up front   Pt CB # [phone]

## 2017-10-04 NOTE — Telephone Encounter (Signed)
Forms have been faxed to [employer].  Received successful fax confirmation.  Will leave encounter open to f/u on.

## 2017-10-24 NOTE — Telephone Encounter (Signed)
Received letter from [employer] stating that pt has been approved for OFEV. Nothing further is needed.

## 2017-11-08 ENCOUNTER — Telehealth: Payer: Self-pay | Admitting: Pulmonary Disease

## 2017-11-08 NOTE — Telephone Encounter (Signed)
Received fax from [employer] stating that the pt reported some side effects from her OFEV on 11/07/17    Called and spoke with the pt  She states that since starting on ofev she has been having some gas and indigestion  She has also had diarrhea- minimal  She states the symptoms are tolerable She was advised by nurse with the drug company to avoid foods that cause gas and try liquid immodium  She states she is going to modify her diet and will try immodium if not improving  Will forward to Dr. Isaiah Serge as [name]

## 2017-11-12 ENCOUNTER — Encounter (HOSPITAL_COMMUNITY)
Admission: RE | Admit: 2017-11-12 | Discharge: 2017-11-12 | Disposition: A | Discharge status: Home / Self Care | Payer: No Typology Code available for payment source | Source: Ambulatory Visit | Attending: Pulmonary Disease | Admitting: Pulmonary Disease

## 2017-11-12 ENCOUNTER — Telehealth: Payer: Self-pay | Admitting: Pulmonary Disease

## 2017-11-12 NOTE — Telephone Encounter (Signed)
Called and spoke with Diane, she states that the patient is too weak to do the program. Sedalia Muta has advised that we send the patient to physical therapy first then we can send them to cardiac rehab.    Dr. Isaiah Serge please advise, thank you.

## 2017-11-13 NOTE — Telephone Encounter (Signed)
Message will be closed per triage protocol.

## 2017-11-13 NOTE — Telephone Encounter (Signed)
Nothing needed at this point. I will reassess her symptoms at clinic visit next week

## 2017-11-13 NOTE — Telephone Encounter (Signed)
Dr. Isaiah Serge please advise if anything further is needed with this message.

## 2017-11-14 NOTE — Telephone Encounter (Signed)
Spoke to Diane with Pulm rehab and made her aware of below message.  Nothing further is needed.

## 2017-11-14 NOTE — Telephone Encounter (Signed)
I will discuss at office visit next week

## 2017-11-14 NOTE — Progress Notes (Signed)
Incomplete Session Note  Patient Details  Name: Amber Lloyd MRN: [id number] Date of Birth: 09/21/1955 Referring Provider:    Kateri Mc did not complete her pulmonary rehab orientation session.  Patient was unable to complete her orientation session due to being too weak and extremely SOB. Called her pulmonologist to request she start with physical therapy first to be strengthened. Once PT helps her gain strength then they will call us to let us know when she is ready to complete pulmonary rehab.

## 2017-11-18 ENCOUNTER — Encounter: Payer: Self-pay | Admitting: Pulmonary Disease

## 2017-11-18 ENCOUNTER — Ambulatory Visit (INDEPENDENT_AMBULATORY_CARE_PROVIDER_SITE_OTHER): Payer: No Typology Code available for payment source | Admitting: Pulmonary Disease

## 2017-11-18 ENCOUNTER — Ambulatory Visit (INDEPENDENT_AMBULATORY_CARE_PROVIDER_SITE_OTHER): Payer: No Typology Code available for payment source | Admitting: *Deleted

## 2017-11-18 VITALS — BP 132/64 | HR 81 | Ht 64.0 in | Wt 168.0 lb

## 2017-11-18 DIAGNOSIS — J849 Interstitial pulmonary disease, unspecified: Secondary | ICD-10-CM

## 2017-11-18 DIAGNOSIS — Z23 Encounter for immunization: Secondary | ICD-10-CM

## 2017-11-18 DIAGNOSIS — J84112 Idiopathic pulmonary fibrosis: Secondary | ICD-10-CM

## 2017-11-18 DIAGNOSIS — Z5181 Encounter for therapeutic drug level monitoring: Secondary | ICD-10-CM

## 2017-11-18 MED ORDER — TIOTROPIUM BROMIDE-OLODATEROL 2.5-2.5 MCG/ACT IN AERS: 2.0000 | INHALATION_SPRAY | Freq: Every day | RESPIRATORY_TRACT | 0 refills | Status: AC

## 2017-11-18 NOTE — Progress Notes (Signed)
SIX MIN WALK 11/18/2017 07/19/2017  Medications albuterol neb,iron,omeprazole,sertraline,ofev,multi vit at 0745 -  Supplimental Oxygen during Test? (L/min) Yes No  O2 Flow Rate 2 -  Type Pulse -  Laps 3 -  Partial Lap (in Meters) 0 -  Baseline BP (sitting) 132/60 -  Baseline Heartrate 66 -  Baseline Dyspnea (Borg Scale) 3 -  Baseline Fatigue (Borg Scale) 3 -  Baseline SPO2 95 -  BP (sitting) 138/72 -  Heartrate 86 -  Dyspnea (Borg Scale) 4 -  Fatigue (Borg Scale) 7 -  SPO2 92 -  BP (sitting) 132/64 -  Heartrate 83 -  SPO2 97 -  Stopped or Paused before Six Minutes Yes -  Other Symptoms at end of Exercise amb slow, needed to stop after going around cone, because of dizzyness -  Interpretation Dizziness;Leg pain -  Distance Completed 144 -  Tech Comments: - stopped mid lap 2 due to dizziness and low O2sats, Dr. Isaiah Serge wants O2 with exertion and at night.

## 2017-11-18 NOTE — Progress Notes (Signed)
ARLETH MCCULLAR    [id number]    March 20, 1956  Primary Care Physician:McElroy, Willodean Rosenthal  Referring Physician: Jacquelin Hawking, PA-C [address] [place], [place] [address]  Chief complaint: Follow up for IPF, on ofev since June 2019  HPI: 62 year old with history of allergies, anemia, depression, irritable bowel syndrome.  She has complaints of dyspnea on exertion for many months.  This worsened in mid December associated with dizziness and presyncope.  She is evaluated by primary care who gave her prednisone and nebulizer with mild improvement.  Still has ongoing symptoms associated with cough, yellow mucus.  Denies any fevers, chills and she had a chest x-ray in December which shows interstitial opacities and has been referred to pulmonary for further evaluation.  Family history significant for alpha-1 antitrypsin deficiency.  Evaluated in 2010 with MZ phenotype and normal levels.  Labs noted for mild elevation in CCP. She has been seen by Dr. Allena Katz, rheumatology at [facility] who feels there is no evidence of rheumatoid arthritis.  Pets: 3 cats, 4 dogs, turtle, fish, frog.  No birds or farm animals Occupation: Facilities manager for patients with dementia Exposures: Has leaky basement, possible mold.  She was employed in a [employer] for 3 years in 1970s and was exposed to fiber at that time. Smoking history: 30-pack-year smoking history.  Quit in 2009 Travel History: Not significant  Interim history: Started on ofev 3 weeks ago.  She is tolerating it well except for minor GI symptoms of indigestion, nausea.  Continues on supplemental oxygen.  She has been referred to rehab but could not start due to weakness States that breathing is slightly worsened since last visit.  Complains of dyspnea with exertion, productive cough with clear mucus, occasional wheezing.  Outpatient Encounter Medications as of 11/18/2017  Medication Sig  . albuterol (PROVENTIL HFA;VENTOLIN HFA) 108 (90  Base) MCG/ACT inhaler Inhale 2 puffs into the lungs every 6 (six) hours as needed for wheezing or shortness of breath.  Marland Kitchen albuterol (PROVENTIL) (2.5 MG/3ML) 0.083% nebulizer solution Take 3 mLs (2.5 mg total) by nebulization every 6 (six) hours as needed for wheezing or shortness of breath.  . DULERA 100-5 MCG/ACT AERO Inhale 2 puffs into the lungs 2 (two) times daily.  . Ibuprofen (ADVIL PO) Take 1 tablet by mouth daily as needed. For shoulder pain and headaches   . IRON PO Take 1 tablet by mouth daily.  Marland Kitchen omeprazole (PRILOSEC) 40 MG capsule Take 1 capsule (40 mg total) by mouth daily.  . OXYGEN Inhale into the lungs.  . sertraline (ZOLOFT) 50 MG tablet Take 100 mg by mouth daily.    No facility-administered encounter medications on file as of 11/18/2017.     Allergies as of 11/18/2017  . (No Known Allergies)    Past Medical History:  Diagnosis Date  . Anemia   . Anxiety   . Depression   . GERD (gastroesophageal reflux disease)   . IBS (irritable bowel syndrome)   . ILD (interstitial lung disease) (HCC)     Past Surgical History:  Procedure Laterality Date  . ABDOMINAL HYSTERECTOMY    . PARTIAL HYSTERECTOMY Right 1983  . TUBAL LIGATION  1981    Family History  Problem Relation Age of Onset  . Alzheimer's disease Mother   . Anemia Mother   . Anemia Sister   . Anemia Daughter   . Anemia Sister     Social History   Socioeconomic History  . Marital  status: Married    Spouse name: Not on file  . Number of children: Not on file  . Years of education: Not on file  . Highest education level: Not on file  Occupational History  . Not on file  Social Needs  . Financial resource strain: Not on file  . Food insecurity:    Worry: Not on file    Inability: Not on file  . Transportation needs:    Medical: Not on file    Non-medical: Not on file  Tobacco Use  . Smoking status: Former Smoker    Packs/day: 1.00    Years: 34.00    Pack years: 34.00    Types: Cigarettes     Last attempt to quit: 07/27/2007    Years since quitting: 10.3  . Smokeless tobacco: Never Used  Substance and Sexual Activity  . Alcohol use: No  . Drug use: No  . Sexual activity: Never    Birth control/protection: None  Lifestyle  . Physical activity:    Days per week: Not on file    Minutes per session: Not on file  . Stress: Not on file  Relationships  . Social connections:    Talks on phone: Not on file    Gets together: Not on file    Attends religious service: Not on file    Active member of club or organization: Not on file    Attends meetings of clubs or organizations: Not on file    Relationship status: Not on file  . Intimate partner violence:    Fear of current or ex partner: Not on file    Emotionally abused: Not on file    Physically abused: Not on file    Forced sexual activity: Not on file  Other Topics Concern  . Not on file  Social History Narrative  . Not on file   Review of systems: Review of Systems  Constitutional: Negative for fever and chills.  HENT: Negative.   Eyes: Negative for blurred vision.  Respiratory: as per HPI  Cardiovascular: Negative for chest pain and palpitations.  Gastrointestinal: Negative for vomiting, diarrhea, blood per rectum. Genitourinary: Negative for dysuria, urgency, frequency and hematuria.  Musculoskeletal: Negative for myalgias, back pain and joint pain.  Skin: Negative for itching and rash.  Neurological: Negative for dizziness, tremors, focal weakness, seizures and loss of consciousness.  Endo/Heme/Allergies: Negative for environmental allergies.  Psychiatric/Behavioral: Negative for depression, suicidal ideas and hallucinations.  All other systems reviewed and are negative.  Physical Exam: Blood pressure 132/64, pulse 81, height 5\' 4"  (1.626 m), weight 168 lb (76.2 kg), SpO2 96 %. Gen:      No acute distress HEENT:  EOMI, sclera anicteric Neck:     No masses; no thyromegaly Lungs:    Bilateral basilar  crackles.; normal respiratory effort CV:         Regular rate and rhythm; no murmurs Abd:      + bowel sounds; soft, non-tender; no palpable masses, no distension Ext:    No edema; adequate peripheral perfusion Skin:      Warm and dry; no rash Neuro: alert and oriented x 3 Psych: normal mood and affect  Data Reviewed: Imaging Chest x-ray 03/01/08-  Clear lungs, no acute cardiopulmonary disease. Chest x-ray 05/13/17-bilateral interstitial opacities, more at the bases.  New from 2009. High-resolution CT chest 06/25/17-reticular opacities, bronchiolectasis with basal gradient.  No honeycombing.  Moderate centrilobular and paraseptal emphysema. Reviewed the images personally  PFTs 06/25/17 FVC 2.37 [  33%], FEV1 1.97 [5 9%], F/F 83, TLC 75%, DLCO 20% Mild restriction with severe diffusion defect.  Labs Alpha-1 antitrypsin 02/16/09-PIMZ, 114 mg/dL 8/75/64- 332 mg/dL  CBC 95/18/84-ZYS 6.7, eosinophils 6%, absolute eosinophil count 400 LFTs 11/19/2017-within normal limits.  Connective tissue serologies 07/05/17-ANA, ANCA, Ro, La, scl 70 negative, aldolase 3.6 CCP 20, double-stranded DNA 7, anticardiolipin antibody 49 Rheumatoid factor < 14 Hypersensitivity panel-negative  Assessment:  Pulmonary fibrosis, probable IPF CT scan reviewed which shows fibrosis in probable UIP pattern.  There is no honeycombing She does not have any significant exposures except for possible mold in basement.  Hypersensitivity panel is negative Serologies are positive for borderline elevation CCP, double-stranded DNA and anticardiolipin antibody.  No evidence of connective tissue disease per rheumatology consult. It is possible that the ILD is the first manifestation of connective tissue disease.  We will continue to monitor this closely with the help of rheumatology. For now we will treat this as IPF and try anti-fibrotics.    Started on Ofev in early June.  Decided against esbriet as she has history of piebaldism and  tends to sunburn easily. Continue monitoring LFTs, Check LFTs and 6 MW Repeat high-resolution CT, spirometry and diffusion capacity. Defer pulmonary rehab as she is too weak to complete therapy.   Emphysema Family history of alpha-1 antitrypsin deficiency MZ phenotype  She has moderate emphysema on CT scan with no obstruction on PFTs. A1AT levels are normal.  There is no indication at present for replacement therapy. Continue Dulera inhaler, albuterol as needed Supplemental oxygen  GERD She has significant issues with GERD, acid reflux. Reports food gets stuck at times while swallowing.  Continue on omeprazole 40 mg once daily. Revaluate at next visit and consider GI eval with EGD, manometery.   Goals of care I am concerned that she has progressive disease given the increase in symptoms over the past few months.  Prognosis is poor.  I have encouraged her to start discussions with family about goals of care in case her condition were to deteriorate and advised that she should strongly consider DNR status.  Mrs. Minelli will discuss with her son.  Health maintenance 02/28/2017-influenza Give Pneumovax today.  More then 1/2 the time of the 40 min visit was spent in counseling and/or coordination of care with the patient and family.  Plan/Recommendations: - Continue ofev - 6 MW test, repeat high res CT, PFTs - Follow LFTs - Continue Dulera inhaler, continue albuterol, supplemental O2, omeprazole - Pneumovax.  Chilton Greathouse MD [facility] 11/18/2017, 2:43 PM  CC: Jacquelin Hawking, PA-C

## 2017-11-18 NOTE — Patient Instructions (Addendum)
Schedule you for spirometry, diffusion capacity and high-resolution CT. We will try to get this done at any point in the next 6 days We will give samples of Stiolto.  Try this instead of Dulera.  If this works better than we may transition you to this new inhaler Follow-up in 2 to 4 weeks with comprehensive metabolic panel We will give you a Pneumovax today.

## 2017-11-19 ENCOUNTER — Other Ambulatory Visit (HOSPITAL_COMMUNITY)
Admission: RE | Admit: 2017-11-19 | Discharge: 2017-11-19 | Disposition: A | Discharge status: Home / Self Care | Payer: No Typology Code available for payment source | Source: Ambulatory Visit | Attending: Pulmonary Disease | Admitting: Pulmonary Disease

## 2017-11-19 ENCOUNTER — Ambulatory Visit (HOSPITAL_COMMUNITY)
Admission: RE | Admit: 2017-11-19 | Discharge: 2017-11-19 | Disposition: A | Discharge status: Home / Self Care | Payer: No Typology Code available for payment source | Source: Ambulatory Visit | Attending: Pulmonary Disease | Admitting: Pulmonary Disease

## 2017-11-19 DIAGNOSIS — J438 Other emphysema: Secondary | ICD-10-CM | POA: Insufficient documentation

## 2017-11-19 DIAGNOSIS — J849 Interstitial pulmonary disease, unspecified: Secondary | ICD-10-CM | POA: Insufficient documentation

## 2017-11-19 DIAGNOSIS — I700 Atherosclerosis of aorta: Secondary | ICD-10-CM | POA: Insufficient documentation

## 2017-11-19 LAB — COMPREHENSIVE METABOLIC PANEL
ALK PHOS: 76 U/L (ref 38–126)
ALT: 20 U/L (ref 0–44)
ANION GAP: 9 (ref 5–15)
AST: 21 U/L (ref 15–41)
Albumin: 3.5 g/dL (ref 3.5–5.0)
BUN: 6 mg/dL — ABNORMAL LOW (ref 8–23)
CHLORIDE: 108 mmol/L (ref 98–111)
CO2: 27 mmol/L (ref 22–32)
Calcium: 8.9 mg/dL (ref 8.9–10.3)
Creatinine, Ser: 0.67 mg/dL (ref 0.44–1.00)
Glucose, Bld: 96 mg/dL (ref 70–99)
POTASSIUM: 3.9 mmol/L (ref 3.5–5.1)
Sodium: 144 mmol/L (ref 135–145)
Total Bilirubin: 0.8 mg/dL (ref 0.3–1.2)
Total Protein: 6.8 g/dL (ref 6.5–8.1)

## 2017-11-20 ENCOUNTER — Telehealth: Payer: Self-pay | Admitting: Pulmonary Disease

## 2017-11-20 DIAGNOSIS — J84112 Idiopathic pulmonary fibrosis: Secondary | ICD-10-CM | POA: Insufficient documentation

## 2017-11-20 NOTE — Telephone Encounter (Signed)
Called and spoke with patient regarding results.  Informed the patient of results and recommendations today. Pt verbalized understanding and denied any questions or concerns at this time.  Nothing further needed.  

## 2017-11-21 ENCOUNTER — Ambulatory Visit (HOSPITAL_COMMUNITY)
Admission: RE | Admit: 2017-11-21 | Discharge: 2017-11-21 | Disposition: A | Discharge status: Home / Self Care | Payer: No Typology Code available for payment source | Source: Ambulatory Visit | Attending: Pulmonary Disease | Admitting: Pulmonary Disease

## 2017-11-21 DIAGNOSIS — J849 Interstitial pulmonary disease, unspecified: Secondary | ICD-10-CM | POA: Insufficient documentation

## 2017-11-21 LAB — PULMONARY FUNCTION TEST
DL/VA % pred: 42 %
DL/VA: 2.05 ml/min/mmHg/L
DLCO UNC % PRED: 25 %
DLCO unc: 6.14 ml/min/mmHg
FEF 25-75 Pre: 2.03 L/sec
FEF2575-%Pred-Pre: 92 %
FEV1-%Pred-Pre: 76 %
FEV1-PRE: 1.88 L
FEV1FVC-%Pred-Pre: 105 %
FEV6-%Pred-Pre: 74 %
FEV6-Pre: 2.31 L
FEV6FVC-%Pred-Pre: 104 %
FVC-%PRED-PRE: 71 %
FVC-Pre: 2.31 L
Pre FEV1/FVC ratio: 82 %
Pre FEV6/FVC Ratio: 100 %

## 2017-11-22 ENCOUNTER — Telehealth: Payer: Self-pay | Admitting: Pulmonary Disease

## 2017-11-22 NOTE — Telephone Encounter (Signed)
Called patient, unable to reach left message to give us a call back. 

## 2017-11-24 IMAGING — US ULTRASOUND LEFT BREAST LIMITED
1 series · 12 of 12 positions shown · non-contrast
Comparison: 01/26/2016

ACR Breast Density Category a: The breast tissue is almost entirely
fatty.

CLINICAL DATA: Mass left breast identified on recent screening
mammogram dated 01/26/2016. This mammogram performed on 01/26/2016
was the patient's new baseline mammogram. There are no mammograms
for comparison.

EXAM:
2D DIGITAL DIAGNOSTIC LEFT MAMMOGRAM WITH CAD AND ADJUNCT TOMO
ULTRASOUND LEFT BREAST

[Series 1: ultrasound left breast limited · 0.07mm/px · 12 of 12 slices shown]
[im 1/12]
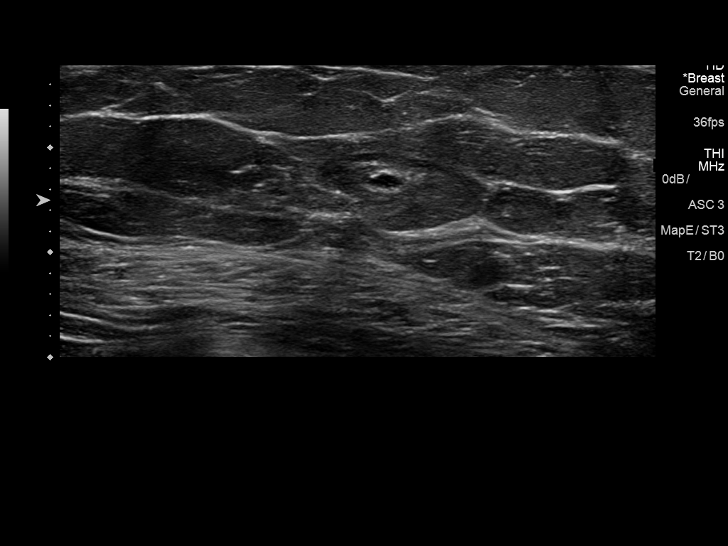
[im 2/12]
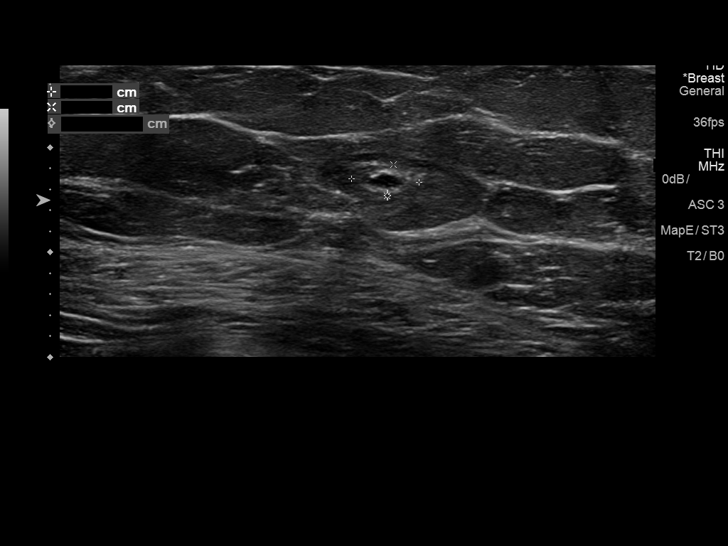
[im 3/12]
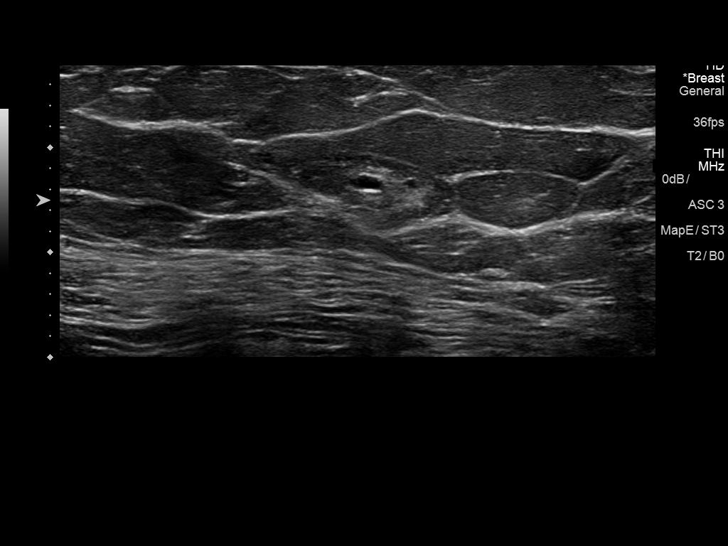
[im 4/12]
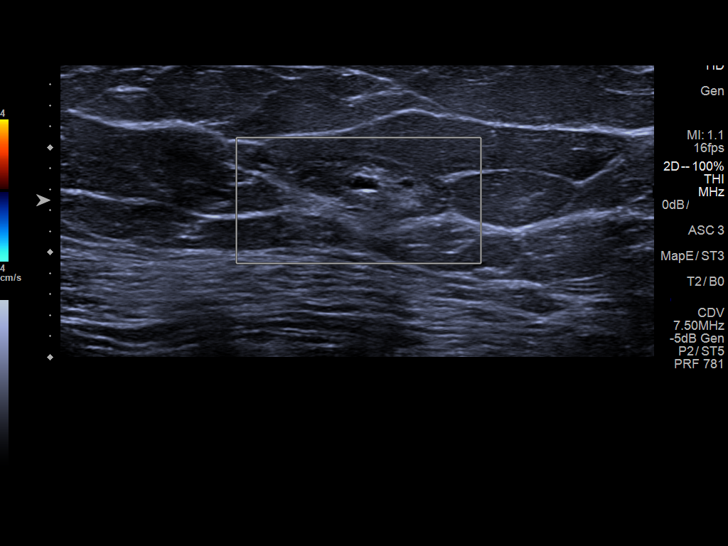
[im 5/12]
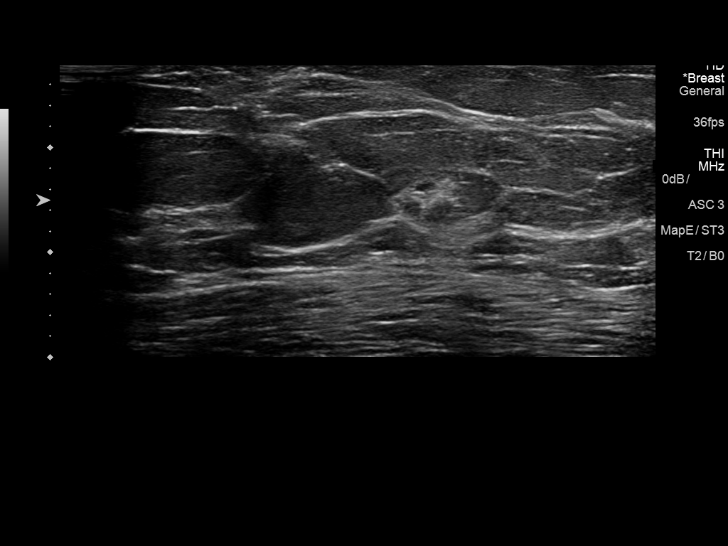
[im 6/12]
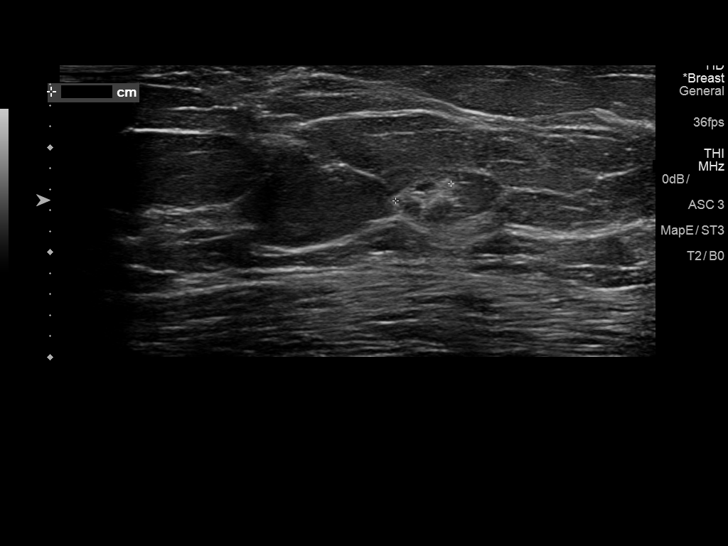
[im 7/12]
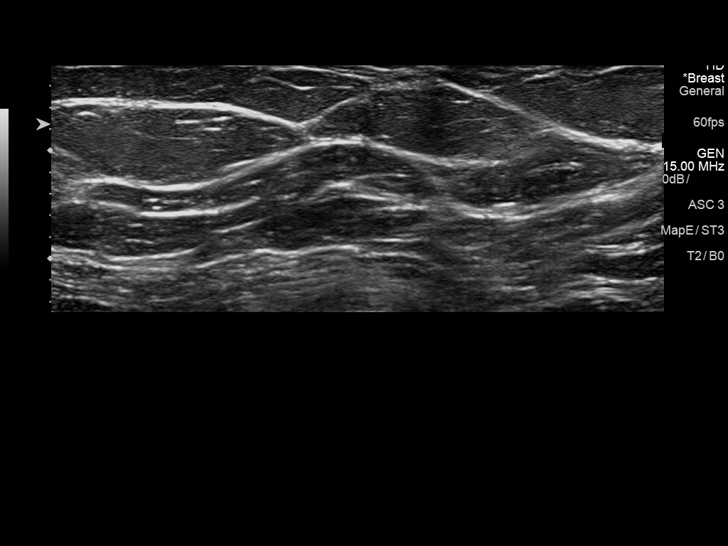
[im 8/12]
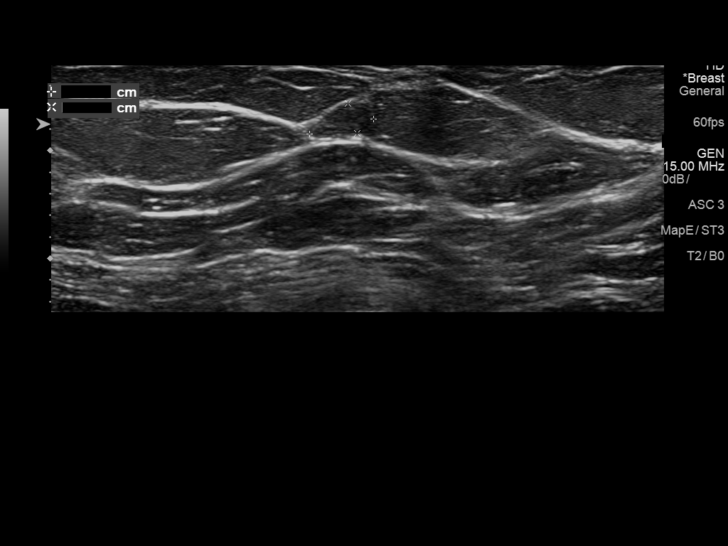
[im 9/12]
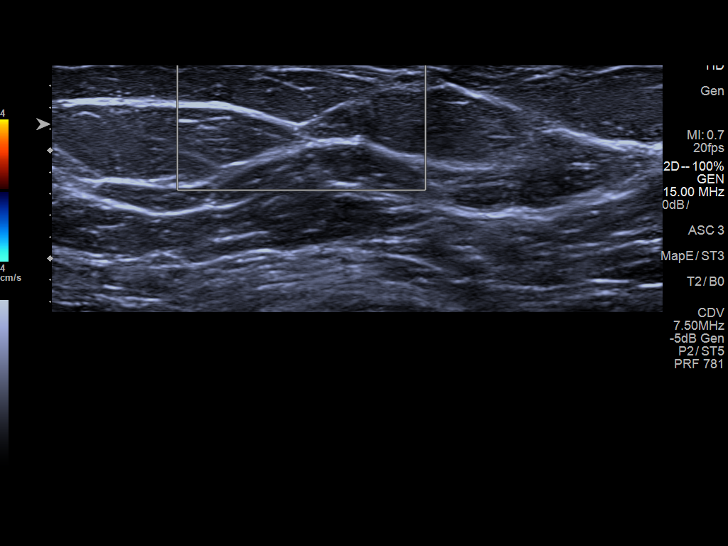
[im 10/12]
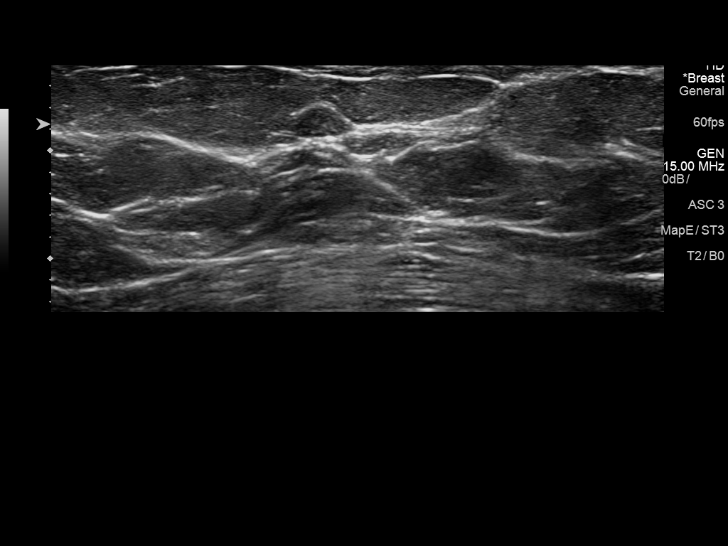
[im 11/12]
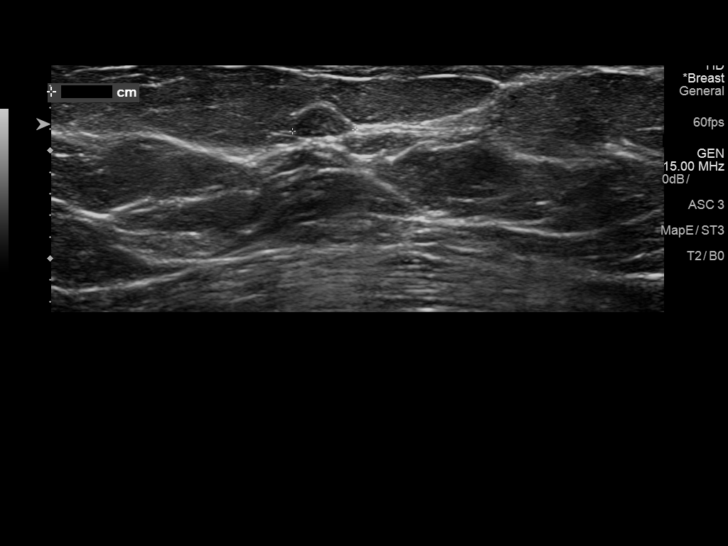
[im 12/12]
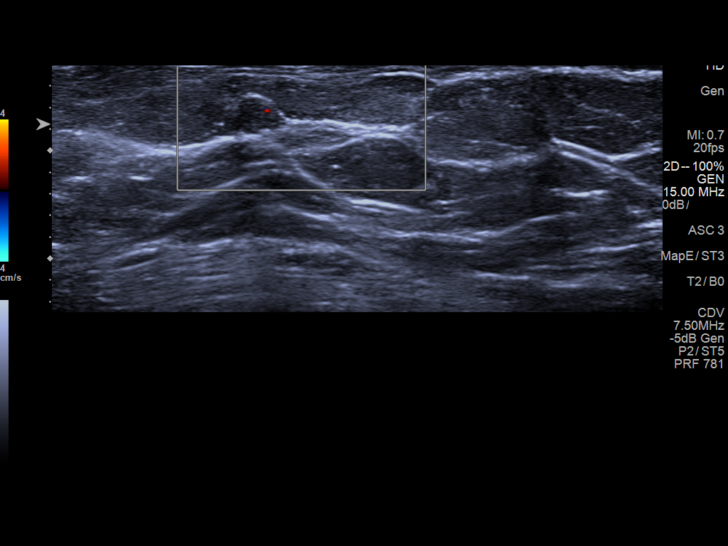

[12 of 12 positions shown; findings below may reference images not displayed]

FINDINGS: There is a circumscribed oval nodule in the medial left breast,
periareolar.

Mammographic images were processed with CAD.

On physical exam, no mass is palpated in the medial left breast.

Targeted ultrasound is performed, showing a nearly isoechoic
circumscribed oval nodule in the [DATE] position of the left breast 2
cm from the nipple measuring 6 x 6 x 3 mm. This is felt to represent
the mammographically detected nodule and may be an intramammary
lymph node or fibroadenoma. Incidentally noted at [DATE] position 3-4
cm from the nipple are 2 subcentimeter cysts. No suspicious findings
are seen on ultrasound.
IMPRESSION: Probably benign circumscribed 6 mm nodule in the [DATE] position of
the left breast 2 cm from the nipple. This is most likely an
intramammary lymph node or fibroadenoma. There are no prior
mammograms for comparison. The mass is best seen on mammography.

RECOMMENDATION:
Diagnostic left mammogram is recommended in 6 months.

I have discussed the findings and recommendations with the patient.
Results were also provided in writing at the conclusion of the
visit. If applicable, a reminder letter will be sent to the patient
regarding the next appointment.

BI-RADS CATEGORY  3: Probably benign.

## 2017-11-25 NOTE — Telephone Encounter (Signed)
PFT scores have not changed much since January.  Dr De Hollingshead will be able to go over results in more detail when he comes back.Marland Kitchen

## 2017-11-25 NOTE — Telephone Encounter (Signed)
I called and spoke with Amber Lloyd. Agree that PFTs look stable  Recommended continuing current therapy and we will review again at return office visit. Nothing further needed.

## 2017-11-25 NOTE — Telephone Encounter (Signed)
Patient returned phone call. °

## 2017-11-25 NOTE — Telephone Encounter (Signed)
Spoke with patient, she is aware of CY's answer. She still wishes to hear Dr. Shirlee More response. She is advised that Dr. Isaiah Serge will not be back into the office next week.   Dr. Isaiah Serge, please advise on her results. Thanks!

## 2017-11-25 NOTE — Telephone Encounter (Signed)
Notes recorded by Chilton Greathouse, MD on 11/20/2017 at 2:01 PM EDT Please let patient know that he shows slightly worsening fibrosis compared to earlier this year. Labs are normal I will review PFTs once they are available.  Spoke with patient. She stated that she was calling in regards to her PFT results. Patient stated that she was advised by [facility] that Dr. Isaiah Serge would want to speak with her personally about results. Advised patient that Dr. Isaiah Serge is on vacation this week, but I could ask another provider to review her PFT results so that she doesn't have to worry all week. She stated that this would be ok.   Dr. Maple Hudson, she had her PFT on 11/21/17. Is there any way possible you would be able to provide some insight on her results? She is very anxious. Thanks!

## 2017-11-29 ENCOUNTER — Telehealth: Payer: Self-pay | Admitting: Pulmonary Disease

## 2017-11-29 MED ORDER — TIOTROPIUM BROMIDE-OLODATEROL 2.5-2.5 MCG/ACT IN AERS: 2.0000 | INHALATION_SPRAY | Freq: Every day | RESPIRATORY_TRACT | 11 refills | Status: DC

## 2017-11-29 NOTE — Telephone Encounter (Signed)
Rx for stiolto sent to pt's pharmacy of choice.  Called pt letting her know this had been done. Pt expressed understanding. Nothing further needed.

## 2017-12-10 ENCOUNTER — Telehealth: Payer: Self-pay | Admitting: Pulmonary Disease

## 2017-12-10 NOTE — Telephone Encounter (Signed)
Spoke with pt. She is needing samples of Stiolto. Pt does not have insurance at this time and she knows that she will not be able to afford it out of pocket. Samples and patient assistance forms have been left at the front desk. Nothing further was needed.

## 2017-12-11 ENCOUNTER — Telehealth: Payer: Self-pay | Admitting: Pulmonary Disease

## 2017-12-11 NOTE — Telephone Encounter (Signed)
Spoke with Amber Lloyd up front - per patient her son made arrangements specifically to accompany patient to that 7.30.19 appt w/ Dr Isaiah Serge as recommended by Dr Isaiah Serge.  Now that appt is being changed (Dr Isaiah Serge cancelled office that afternoon) Patient and son would like to know if there is any way that patient can still be seen that day, perhaps at a different time?  Please advise, thank you.  Per 6.24.19 ov w/ Dr Isaiah Serge: Goals of care I am concerned that she has progressive disease given the increase in symptoms over the past few months.  Prognosis is poor.  I have encouraged her to start discussions with family about goals of care in case her condition were to deteriorate and advised that she should strongly consider DNR status.  Amber Lloyd will discuss with her son.

## 2017-12-11 NOTE — Telephone Encounter (Signed)
Attempted to call patient today regarding r/s for Dr. Isaiah Serge OV 12/24/2017. There is nothing available that morning, there is openings the day before 12/23/2017. I did not receive an answer at time of call. I have left a voicemail message for pt to return call. X1

## 2017-12-11 NOTE — Telephone Encounter (Signed)
I can stay back till 1:30 that day. Would they be able to come between by last pt in AM to that time in afternoon?

## 2017-12-12 NOTE — Telephone Encounter (Signed)
Attempted to call pt. I did not receive an answer. I have left a message for pt to return our call.  

## 2017-12-12 NOTE — Telephone Encounter (Signed)
Called and spoke with pt regarding upcoming appt 12/24/17 with Dr. Isaiah Serge Per Dr. Isaiah Serge it is okay to see pt on this day, afternoon clinic is cancelled for this day; Therefore he has agreed to see pt prior to 1:30pm this day. Pt scheduled appt ov for 12/24/17 at 12:00pm Pt had CT chest on 11/18/17; PFT at [facility] 11/21/2017, and 6 min walk schedule 12/24/17 at 11:30am Pt was advised to arrive by 11:15am on 12/24/2017 Nothing further needed.

## 2017-12-17 ENCOUNTER — Telehealth: Payer: Self-pay | Admitting: Pulmonary Disease

## 2017-12-17 ENCOUNTER — Other Ambulatory Visit: Payer: Self-pay | Admitting: Physician Assistant

## 2017-12-17 MED ORDER — TIOTROPIUM BROMIDE-OLODATEROL 2.5-2.5 MCG/ACT IN AERS: 2.0000 | INHALATION_SPRAY | Freq: Every day | RESPIRATORY_TRACT | 11 refills | Status: DC

## 2017-12-17 NOTE — Telephone Encounter (Signed)
Spoke with patient. She stated that the [facility] will cover her Stiolto along with her Ofev. They just need to have a RX faxed over to them. Advised her that I would send the RX now. She verbalized understanding.   Nothing else needed at time of call.

## 2017-12-24 ENCOUNTER — Ambulatory Visit (INDEPENDENT_AMBULATORY_CARE_PROVIDER_SITE_OTHER): Admitting: Pulmonary Disease

## 2017-12-24 ENCOUNTER — Encounter: Payer: Self-pay | Admitting: Pulmonary Disease

## 2017-12-24 ENCOUNTER — Ambulatory Visit (INDEPENDENT_AMBULATORY_CARE_PROVIDER_SITE_OTHER): Admitting: *Deleted

## 2017-12-24 ENCOUNTER — Other Ambulatory Visit (INDEPENDENT_AMBULATORY_CARE_PROVIDER_SITE_OTHER)

## 2017-12-24 VITALS — BP 90/58 | HR 74 | Ht 64.0 in | Wt 169.0 lb

## 2017-12-24 DIAGNOSIS — J84112 Idiopathic pulmonary fibrosis: Secondary | ICD-10-CM

## 2017-12-24 DIAGNOSIS — Z5181 Encounter for therapeutic drug level monitoring: Secondary | ICD-10-CM

## 2017-12-24 LAB — HEPATIC FUNCTION PANEL
ALK PHOS: 72 U/L (ref 39–117)
ALT: 15 U/L (ref 0–35)
AST: 19 U/L (ref 0–37)
Albumin: 4.1 g/dL (ref 3.5–5.2)
BILIRUBIN DIRECT: 0.1 mg/dL (ref 0.0–0.3)
Total Bilirubin: 1 mg/dL (ref 0.2–1.2)
Total Protein: 7.3 g/dL (ref 6.0–8.3)

## 2017-12-24 NOTE — Progress Notes (Signed)
Amber Lloyd    [id number]    January 12, 1956  Primary Care Physician:McElroy, Willodean Rosenthal  Referring Physician: Jacquelin Hawking, PA-C [address] [place], [place] [address]  Chief complaint: Follow up for IPF, on ofev since June 2019  HPI: 62 year old with history of allergies, anemia, depression, irritable bowel syndrome.  She has complaints of dyspnea on exertion for many months.  This worsened in mid December associated with dizziness and presyncope.  She is evaluated by primary care who gave her prednisone and nebulizer with mild improvement.  Still has ongoing symptoms associated with cough, yellow mucus.  Denies any fevers, chills and she had a chest x-ray in December which shows interstitial opacities and has been referred to pulmonary for further evaluation.  Family history significant for alpha-1 antitrypsin deficiency.  Evaluated in 2010 with MZ phenotype and normal levels.  Labs noted for mild elevation in CCP. She has been seen by Dr. Allena Katz, rheumatology at [facility] who feels there is no evidence of rheumatoid arthritis.  Pets: 3 cats, 4 dogs, turtle, fish, frog.  No birds or farm animals Occupation: Facilities manager for patients with dementia Exposures: Has leaky basement, possible mold.  She was employed in a [employer] for 3 years in 1970s and was exposed to fiber at that time. Smoking history: 30-pack-year smoking history.  Quit in 2009 Travel History: Not significant  Interim history: Continues on Ofev.  Chief complaint is diarrhea Continues to have dyspnea, fatigue.  Continues on supplemental oxygen She has been referred to pulmonary rehab but could not start due to weakness.  Outpatient Encounter Medications as of 12/24/2017  Medication Sig  . albuterol (PROVENTIL) (2.5 MG/3ML) 0.083% nebulizer solution Take 3 mLs (2.5 mg total) by nebulization every 6 (six) hours as needed for wheezing or shortness of breath.  . clonazePAM (KLONOPIN) 1 MG tablet Take 1 mg by  mouth at bedtime.  . Ibuprofen (ADVIL PO) Take 1 tablet by mouth daily as needed. For shoulder pain and headaches   . IRON PO Take 1 tablet by mouth daily.  Marland Kitchen omeprazole (PRILOSEC) 40 MG capsule Take 1 capsule (40 mg total) by mouth daily.  . OXYGEN Inhale into the lungs.  Marland Kitchen PROVENTIL HFA 108 (90 Base) MCG/ACT inhaler INHALE 2 PUFFS BY MOUTH EVERY 6 HOURS AS NEEDED FOR COUGHING, WHEEZING, OR SHORTNESS OF BREATH  . sertraline (ZOLOFT) 50 MG tablet Take 100 mg by mouth daily.   . Tiotropium Bromide-Olodaterol (STIOLTO RESPIMAT) 2.5-2.5 MCG/ACT AERS Inhale 2 puffs into the lungs daily.   No facility-administered encounter medications on file as of 12/24/2017.     Allergies as of 12/24/2017  . (No Known Allergies)    Past Medical History:  Diagnosis Date  . Anemia   . Anxiety   . Depression   . GERD (gastroesophageal reflux disease)   . IBS (irritable bowel syndrome)   . ILD (interstitial lung disease) (HCC)     Past Surgical History:  Procedure Laterality Date  . ABDOMINAL HYSTERECTOMY    . PARTIAL HYSTERECTOMY Right 1983  . TUBAL LIGATION  1981    Family History  Problem Relation Age of Onset  . Alzheimer's disease Mother   . Anemia Mother   . Anemia Sister   . Anemia Daughter   . Anemia Sister     Social History   Socioeconomic History  . Marital status: Married    Spouse name: Not on file  . Number of children: Not on file  .  Years of education: Not on file  . Highest education level: Not on file  Occupational History  . Not on file  Social Needs  . Financial resource strain: Not on file  . Food insecurity:    Worry: Not on file    Inability: Not on file  . Transportation needs:    Medical: Not on file    Non-medical: Not on file  Tobacco Use  . Smoking status: Former Smoker    Packs/day: 1.00    Years: 34.00    Pack years: 34.00    Types: Cigarettes    Last attempt to quit: 07/27/2007    Years since quitting: 10.4  . Smokeless tobacco: Never Used    Substance and Sexual Activity  . Alcohol use: No  . Drug use: No  . Sexual activity: Never    Birth control/protection: None  Lifestyle  . Physical activity:    Days per week: Not on file    Minutes per session: Not on file  . Stress: Not on file  Relationships  . Social connections:    Talks on phone: Not on file    Gets together: Not on file    Attends religious service: Not on file    Active member of club or organization: Not on file    Attends meetings of clubs or organizations: Not on file    Relationship status: Not on file  . Intimate partner violence:    Fear of current or ex partner: Not on file    Emotionally abused: Not on file    Physically abused: Not on file    Forced sexual activity: Not on file  Other Topics Concern  . Not on file  Social History Narrative  . Not on file   Review of systems: Review of Systems  Constitutional: Negative for fever and chills.  HENT: Negative.   Eyes: Negative for blurred vision.  Respiratory: as per HPI  Cardiovascular: Negative for chest pain and palpitations.  Gastrointestinal: Negative for vomiting, diarrhea, blood per rectum. Genitourinary: Negative for dysuria, urgency, frequency and hematuria.  Musculoskeletal: Negative for myalgias, back pain and joint pain.  Skin: Negative for itching and rash.  Neurological: Negative for dizziness, tremors, focal weakness, seizures and loss of consciousness.  Endo/Heme/Allergies: Negative for environmental allergies.  Psychiatric/Behavioral: Negative for depression, suicidal ideas and hallucinations.  All other systems reviewed and are negative.  Physical Exam: Blood pressure (!) 90/58, pulse 74, height 5\' 4"  (1.626 m), weight 169 lb (76.7 kg), SpO2 95 %. Gen:      No acute distress HEENT:  EOMI, sclera anicteric Neck:     No masses; no thyromegaly Lungs:    Bilateral basal crackles CV:         Regular rate and rhythm; no murmurs Abd:      + bowel sounds; soft, non-tender;  no palpable masses, no distension Ext:    No edema; adequate peripheral perfusion Skin:      Warm and dry; no rash Neuro: alert and oriented x 3 Psych: normal mood and affect  Data Reviewed: Imaging Chest x-ray 03/01/08-  Clear lungs, no acute cardiopulmonary disease. Chest x-ray 05/13/17-bilateral interstitial opacities, more at the bases.  New from 2009. High-resolution CT chest 06/25/17-reticular opacities, bronchiolectasis with basal gradient.  No honeycombing.  Moderate centrilobular and paraseptal emphysema. High-resolution CT 12/24/2017-mild progression of pulmonary fibrosis Reviewed the images personally  PFTs 06/25/17 FVC 2.37 [33%], FEV1 1.97 [5 9%], F/F 83, TLC 75%, DLCO 20% Mild restriction with  severe diffusion defect.  11/21/2017 FVC 2.31 [71%), FEV1 1.88 [476%], DLCO 25% Mild restriction, severe diffusion impairment  6-minute walk 12/24/2017 145 m, post exercise-87% on 2 L  Labs Alpha-1 antitrypsin 02/16/09-PIMZ, 114 mg/dL 1/61/09- 604 mg/dL  CBC 54/09/81-XBJ 6.7, eosinophils 6%, absolute eosinophil count 400 LFTs 11/19/2017-within normal limits.  Connective tissue serologies 07/05/17-ANA, ANCA, Ro, La, scl 70 negative, aldolase 3.6 CCP 20, double-stranded DNA 7, anticardiolipin antibody 49 Rheumatoid factor < 14 Hypersensitivity panel-negative   Assessment:  Pulmonary fibrosis, probable IPF CT scan reviewed which shows fibrosis in probable UIP pattern.  There is no honeycombing She does not have any significant exposures except for possible mold in basement.  Hypersensitivity panel is negative Serologies are positive for borderline elevation CCP, double-stranded DNA and anticardiolipin antibody.  No evidence of connective tissue disease per rheumatology consult. It is possible that the ILD is the first manifestation of connective tissue disease.  We will continue to monitor this closely with the help of rheumatology. For now we will treat this as IPF and try  anti-fibrotics.    Started on Ofev in early June.  Decided against esbriet as she has history of piebaldism and tends to sunburn easily. Continue monitoring LFTs,  Defer pulmonary rehab as she is too weak to complete therapy. Refer to [facility] for lung transplantation.  Emphysema Family history of alpha-1 antitrypsin deficiency MZ phenotype  She has moderate emphysema on CT scan with no obstruction on PFTs. A1AT levels are normal.  There is no indication at present for replacement therapy. Continue Dulera inhaler, albuterol as needed Supplemental oxygen   GERD She has significant issues with GERD, acid reflux. Reports food gets stuck at times while swallowing.  Continue on omeprazole 40 mg once daily. Revaluate at next visit and consider GI eval with EGD, manometery.    Goals of care Discussed goals of care with Mrs. Ewings and her son.  They would want full life support on a temporary basis.    Health maintenance 02/28/2017-influenza 11/18/2017-Pneumovax  More then 1/2 the time of the 40 min visit was spent in counseling and/or coordination of care with the patient and family.  Plan/Recommendations: - Continue ofev - Follow LFTs - Continue Dulera inhaler, continue albuterol, supplemental O2, omeprazole - Referral for lung transplantation.  Chilton Greathouse MD [facility] 12/24/2017, 12:05 PM  CC: Jacquelin Hawking, PA-C

## 2017-12-24 NOTE — Patient Instructions (Signed)
We will check hepatic function panel today Continue ofev We will make a referral to [facility] for lung transplant. Follow-up in 1 to 2 months.

## 2017-12-26 ENCOUNTER — Encounter: Payer: Self-pay | Admitting: Physician Assistant

## 2017-12-26 ENCOUNTER — Ambulatory Visit: Admitting: Physician Assistant

## 2017-12-26 VITALS — BP 98/68 | HR 92 | Temp 97.5°F | Ht 64.0 in | Wt 168.7 lb

## 2017-12-26 DIAGNOSIS — J849 Interstitial pulmonary disease, unspecified: Secondary | ICD-10-CM

## 2017-12-26 NOTE — Progress Notes (Signed)
BP 98/68 (BP Location: Left Arm, Patient Position: Sitting, Cuff Size: Normal)   Pulse 92   Temp (!) 97.5 F (36.4 C) (Other (Comment))   Ht 5\' 4"  (1.626 m)   Wt 168 lb 11.2 oz (76.5 kg)   SpO2 93%   BMI 28.96 kg/m    Subjective:    Patient ID: , female    DOB: 08-15-1955, 62 y.o.   MRN: [id number]  HPI: Amber Lloyd is a 62 y.o. female presenting on 12/26/2017 for Follow-up   HPI   Pt says she was Approved for disability but denied for medicare and medicaid  Pt says that Last week she got champus 02/25/2018- for veterans survivors.  Her card shows that the insurance started on January 1 this year  Pt seeing dr 07-02-1974 for ILD.  She was referred to [facility] for lung transplant.  Her grandson is with her today.  Pt says she is doing okay but is sometimes "wobbly" when walking and she uses her grandson to help stabilize her.   Relevant past medical, surgical, family and social history reviewed and updated as indicated. Interim medical history since our last visit reviewed. Allergies and medications reviewed and updated.    Current Outpatient Medications:  .  albuterol (PROVENTIL) (2.5 MG/3ML) 0.083% nebulizer solution, Take 3 mLs (2.5 mg total) by nebulization every 6 (six) hours as needed for wheezing or shortness of breath., Disp: 75 mL, Rfl: 2 .  clonazePAM (KLONOPIN) 1 MG tablet, Take 1 mg by mouth at bedtime., Disp: , Rfl:  .  Ibuprofen (ADVIL PO), Take 1 tablet by mouth daily as needed. For shoulder pain and headaches , Disp: , Rfl:  .  IRON PO, Take 1 tablet by mouth daily., Disp: , Rfl:  .  Multiple Vitamin (MULTIVITAMIN) capsule, Take 1 capsule by mouth daily., Disp: , Rfl:  .  Nintedanib (OFEV) 150 MG CAPS, Take 150 mg by mouth 2 (two) times daily., Disp: , Rfl:  .  omeprazole (PRILOSEC) 40 MG capsule, Take 1 capsule (40 mg total) by mouth daily., Disp: 90 capsule, Rfl: 3 .  OXYGEN, Inhale into the lungs., Disp: , Rfl:  .  PROVENTIL HFA 108 (90  Base) MCG/ACT inhaler, INHALE 2 PUFFS BY MOUTH EVERY 6 HOURS AS NEEDED FOR COUGHING, WHEEZING, OR SHORTNESS OF BREATH, Disp: 20.1 g, Rfl: 2 .  sertraline (ZOLOFT) 50 MG tablet, Take 100 mg by mouth daily. , Disp: , Rfl:  .  Tiotropium Bromide-Olodaterol (STIOLTO RESPIMAT) 2.5-2.5 MCG/ACT AERS, Inhale 2 puffs into the lungs daily., Disp: 1 Inhaler, Rfl: 11  Review of Systems  Constitutional: Positive for fatigue. Negative for appetite change, chills, diaphoresis, fever and unexpected weight change.  HENT: Positive for congestion, ear pain and voice change. Negative for dental problem, drooling, facial swelling, hearing loss, mouth sores, sneezing, sore throat and trouble swallowing.   Eyes: Positive for visual disturbance. Negative for pain, discharge, redness and itching.  Respiratory: Positive for cough, shortness of breath and wheezing. Negative for choking.   Cardiovascular: Positive for leg swelling. Negative for chest pain and palpitations.  Gastrointestinal: Positive for diarrhea. Negative for abdominal pain, blood in stool, constipation and vomiting.  Endocrine: Positive for cold intolerance, heat intolerance and polydipsia.  Genitourinary: Negative for decreased urine volume, dysuria and hematuria.  Musculoskeletal: Positive for arthralgias. Negative for back pain and gait problem.  Skin: Negative for rash.  Allergic/Immunologic: Positive for environmental allergies.  Neurological: Positive for light-headedness and headaches. Negative for seizures  and syncope.  Hematological: Negative for adenopathy.  Psychiatric/Behavioral: Negative for agitation, dysphoric mood and suicidal ideas. The patient is nervous/anxious.     Per HPI unless specifically indicated above     Objective:    BP 98/68 (BP Location: Left Arm, Patient Position: Sitting, Cuff Size: Normal)   Pulse 92   Temp (!) 97.5 F (36.4 C) (Other (Comment))   Ht 5\' 4"  (1.626 m)   Wt 168 lb 11.2 oz (76.5 kg)   SpO2 93%    BMI 28.96 kg/m   Wt Readings from Last 3 Encounters:  12/26/17 168 lb 11.2 oz (76.5 kg)  12/24/17 169 lb (76.7 kg)  11/18/17 168 lb (76.2 kg)    Physical Exam  Constitutional: She is oriented to person, place, and time. She appears well-developed and well-nourished.  HENT:  Head: Normocephalic and atraumatic.  Pulmonary/Chest: No respiratory distress.  Neurological: She is alert and oriented to person, place, and time.  Skin: Skin is warm and dry.  Psychiatric: She has a normal mood and affect. Her behavior is normal. Thought content normal.  Nursing note and vitals reviewed.   Results for orders placed or performed in visit on 12/24/17  Hepatic function panel  Result Value Ref Range   Total Bilirubin 1.0 0.2 - 1.2 mg/dL   Bilirubin, Direct 0.1 0.0 - 0.3 mg/dL   Alkaline Phosphatase 72 39 - 117 U/L   AST 19 0 - 37 U/L   ALT 15 0 - 35 U/L   Total Protein 7.3 6.0 - 8.3 g/dL   Albumin 4.1 3.5 - 5.2 g/dL      Assessment & Plan:   Encounter Diagnosis  Name Primary?  . Interstitial pulmonary disease (HCC) Yes    -discussed with pt that she will need to get new PCP as we cannot see patients that have insurance.  Pt states understanding -recommended pt use a cane to help stabilize her while walking and decrease risk of a fall

## 2017-12-31 ENCOUNTER — Telehealth: Payer: Self-pay | Admitting: Pulmonary Disease

## 2017-12-31 MED ORDER — TIOTROPIUM BROMIDE-OLODATEROL 2.5-2.5 MCG/ACT IN AERS: 2.0000 | INHALATION_SPRAY | Freq: Every day | RESPIRATORY_TRACT | 11 refills | Status: DC

## 2017-12-31 NOTE — Telephone Encounter (Signed)
Spoke with patient. She stated that the [facility] program had not received the RX that was faxed over on 12/17/17. Patient has requested that we re-fax this RX. Advised patient that I would do so today.   Called [employer] and was told that the best fax number for the RX was [phone].   Will fax RX again to the number above.

## 2017-12-31 NOTE — Telephone Encounter (Signed)
Pt is calling back for an update. Cb is [phone].

## 2018-01-03 ENCOUNTER — Telehealth: Payer: Self-pay | Admitting: Pulmonary Disease

## 2018-01-03 NOTE — Telephone Encounter (Signed)
ATC [employer] at the number provided by the patient to expedite this process. There was no answer so LMOM TCB x1.  Called spoke with patient to make her aware that we have already attempted to begin this process for her. Patient voiced her understanding and is aware we will keep her updated.

## 2018-01-06 NOTE — Telephone Encounter (Signed)
LVM for s/o with [employer] to expedite rx for Ofev 150mg  on pt at phone [phone] opt 3 X2

## 2018-01-07 NOTE — Telephone Encounter (Signed)
LVM for s/o with [employer] to expedite rx for Ofev 150mg  on pt at phone [phone] opt 3 X3

## 2018-01-08 ENCOUNTER — Telehealth: Payer: Self-pay | Admitting: Pulmonary Disease

## 2018-01-08 MED ORDER — TIOTROPIUM BROMIDE-OLODATEROL 2.5-2.5 MCG/ACT IN AERS: 2.0000 | INHALATION_SPRAY | Freq: Every day | RESPIRATORY_TRACT | 11 refills | Status: DC

## 2018-01-08 NOTE — Telephone Encounter (Signed)
Called and spoke to pt.  Pt is requesting that Rx for Stiolto and last form of [facility] patient asisstance be faxed to [facility] at [phone]. Rx has been printed along with last form of patient assistance form to be signed by Dr. Isaiah Serge.  Will route message to myself for f/u.

## 2018-01-09 NOTE — Telephone Encounter (Signed)
Rx and last page of patient assistance form has been faxed to [facility] cares per pt request.  Pt is aware and voiced her understanding. Nothing further is needed.

## 2018-01-10 NOTE — Telephone Encounter (Signed)
Called and spoke with Florentina Addison with [employer] regarding if Ofev 150mg  on pt at phone [phone]  She verified that the Ofev was delivered to patient on 01/09/2018 at [facility] patient to verify delivery, verbalized she rec'd the ofev Nothing further needed.

## 2018-01-13 ENCOUNTER — Telehealth: Payer: Self-pay | Admitting: Pulmonary Disease

## 2018-01-13 MED ORDER — TIOTROPIUM BROMIDE-OLODATEROL 2.5-2.5 MCG/ACT IN AERS: 2.0000 | INHALATION_SPRAY | Freq: Every day | RESPIRATORY_TRACT | 0 refills | Status: DC

## 2018-01-13 NOTE — Telephone Encounter (Signed)
Samples left up front for pt.  Pt aware.  Nothing further needed.  

## 2018-02-13 ENCOUNTER — Other Ambulatory Visit (INDEPENDENT_AMBULATORY_CARE_PROVIDER_SITE_OTHER)

## 2018-02-13 ENCOUNTER — Telehealth: Payer: Self-pay | Admitting: Pulmonary Disease

## 2018-02-13 ENCOUNTER — Encounter: Payer: Self-pay | Admitting: Pulmonary Disease

## 2018-02-13 ENCOUNTER — Ambulatory Visit (INDEPENDENT_AMBULATORY_CARE_PROVIDER_SITE_OTHER): Admitting: Pulmonary Disease

## 2018-02-13 VITALS — BP 126/76 | HR 91 | Temp 98.2°F | Ht 64.0 in | Wt 166.6 lb

## 2018-02-13 DIAGNOSIS — Z5181 Encounter for therapeutic drug level monitoring: Secondary | ICD-10-CM

## 2018-02-13 DIAGNOSIS — J441 Chronic obstructive pulmonary disease with (acute) exacerbation: Secondary | ICD-10-CM

## 2018-02-13 DIAGNOSIS — J84112 Idiopathic pulmonary fibrosis: Secondary | ICD-10-CM

## 2018-02-13 DIAGNOSIS — Z23 Encounter for immunization: Secondary | ICD-10-CM

## 2018-02-13 LAB — HEPATIC FUNCTION PANEL
ALT: 38 U/L — ABNORMAL HIGH (ref 0–35)
AST: 35 U/L (ref 0–37)
Albumin: 3.9 g/dL (ref 3.5–5.2)
Alkaline Phosphatase: 105 U/L (ref 39–117)
BILIRUBIN TOTAL: 0.6 mg/dL (ref 0.2–1.2)
Bilirubin, Direct: 0.1 mg/dL (ref 0.0–0.3)
Total Protein: 7.3 g/dL (ref 6.0–8.3)

## 2018-02-13 NOTE — Patient Instructions (Signed)
We will give you a flu vaccine today We will check hepatic function panel today Follow-up in 1 to 2 months with nurse practitioner

## 2018-02-13 NOTE — Telephone Encounter (Signed)
LMTCB

## 2018-02-13 NOTE — Progress Notes (Signed)
Amber Lloyd    [id number]    Jul 03, 1955  Primary Care Physician:Patient, No Pcp Per  Referring Physician: Jacquelin Hawking, PA-C [address] [place], [place] [address]  Chief complaint: Follow up for IPF, on ofev since June 2019  HPI: 62 year old with history of allergies, anemia, depression, irritable bowel syndrome.  She has complaints of dyspnea on exertion for many months.  This worsened in mid December associated with dizziness and presyncope.  She is evaluated by primary care who gave her prednisone and nebulizer with mild improvement.  Still has ongoing symptoms associated with cough, yellow mucus.  Denies any fevers, chills and she had a chest x-ray in December which shows interstitial opacities and has been referred to pulmonary for further evaluation.  Family history significant for alpha-1 antitrypsin deficiency.  Evaluated in 2010 with MZ phenotype and normal levels.  Labs noted for mild elevation in CCP. She has been seen by Dr. Allena Katz, rheumatology at [facility] who feels there is no evidence of rheumatoid arthritis.  Pets: 3 cats, 4 dogs, turtle, fish, frog.  No birds or farm animals Occupation: Facilities manager for patients with dementia Exposures: Has leaky basement, possible mold.  She was employed in a Orth[employer] for 3 years in 1970s and was exposed to fiber at that time. Smoking history: 30-pack-year smoking history.  Quit in 2009 Travel History: Not significant  Interim history: Continues on Ofev.  She has had diarrhea initially with this but this appears to be improving Continues to have significant dyspnea, fatigue  Referred to transplant at [facility].  Patient has decided not to follow-up with [place] since she would need significant psychosocial support and she does not want her family to be inconvenienced. She has been referred to pulmonary rehab but could not start due to weakness.  Outpatient Encounter Medications as of 02/13/2018  Medication Sig  . albuterol  (PROVENTIL) (2.5 MG/3ML) 0.083% nebulizer solution Take 3 mLs (2.5 mg total) by nebulization every 6 (six) hours as needed for wheezing or shortness of breath.  . clonazePAM (KLONOPIN) 1 MG tablet Take 1 mg by mouth at bedtime.  . Ibuprofen (ADVIL PO) Take 1 tablet by mouth daily as needed. For shoulder pain and headaches   . IRON PO Take 1 tablet by mouth daily.  . Multiple Vitamin (MULTIVITAMIN) capsule Take 1 capsule by mouth daily.  . Nintedanib (OFEV) 150 MG CAPS Take 150 mg by mouth 2 (two) times daily.  Marland Kitchen omeprazole (PRILOSEC) 40 MG capsule Take 1 capsule (40 mg total) by mouth daily.  . OXYGEN Inhale into the lungs.  Marland Kitchen PROVENTIL HFA 108 (90 Base) MCG/ACT inhaler INHALE 2 PUFFS BY MOUTH EVERY 6 HOURS AS NEEDED FOR COUGHING, WHEEZING, OR SHORTNESS OF BREATH  . sertraline (ZOLOFT) 50 MG tablet Take 100 mg by mouth daily.   . Tiotropium Bromide-Olodaterol (STIOLTO RESPIMAT) 2.5-2.5 MCG/ACT AERS Inhale 2 puffs into the lungs daily.  . Tiotropium Bromide-Olodaterol (STIOLTO RESPIMAT) 2.5-2.5 MCG/ACT AERS Inhale 2 puffs into the lungs daily.   No facility-administered encounter medications on file as of 02/13/2018.    Physical Exam: Blood pressure 126/76, pulse 91, temperature 98.2 F (36.8 C), temperature source Oral, height 5\' 4"  (1.626 m), weight 166 lb 9.6 oz (75.6 kg), SpO2 98 %. Gen:      No acute distress HEENT:  EOMI, sclera anicteric Neck:     No masses; no thyromegaly Lungs:    Clear to auscultation bilaterally; normal respiratory effort CV:  Regular rate and rhythm; no murmurs Abd:      + bowel sounds; soft, non-tender; no palpable masses, no distension Ext:    No edema; adequate peripheral perfusion Skin:      Warm and dry; no rash Neuro: alert and oriented x 3 Psych: normal mood and affect  Data Reviewed: Imaging Chest x-ray 03/01/08-  Clear lungs, no acute cardiopulmonary disease. Chest x-ray 05/13/17-bilateral interstitial opacities, more at the bases.  New from  2009. High-resolution CT chest 06/25/17-reticular opacities, bronchiolectasis with basal gradient.  No honeycombing.  Moderate centrilobular and paraseptal emphysema. High-resolution CT 12/24/2017-mild progression of pulmonary fibrosis Reviewed the images personally  PFTs 06/25/17 FVC 2.37 [33%], FEV1 1.97 [5 9%], F/F 83, TLC 75%, DLCO 20% Mild restriction with severe diffusion defect.  11/21/2017 FVC 2.31 [71%), FEV1 1.88 [476%], DLCO 25% Mild restriction, severe diffusion impairment  6-minute walk 12/24/2017 145 m, post exercise-87% on 2 L  Labs Alpha-1 antitrypsin 02/16/09-PIMZ, 114 mg/dL 6/96/78- 938 mg/dL  CBC 03/13/50-[facility] 6.7, eosinophils 6%, absolute eosinophil count 400 LFTs 11/19/2017-within normal limits.  Connective tissue serologies 07/05/17-ANA, ANCA, Ro, La, scl 70 negative, aldolase 3.6 CCP 20, double-stranded DNA 7, anticardiolipin antibody 49 Rheumatoid factor < 14 Hypersensitivity panel-negative   Assessment:  Pulmonary fibrosis, probable IPF CT scan reviewed which shows fibrosis in probable UIP pattern.  There is no honeycombing She does not have any significant exposures except for possible mold in basement.  Hypersensitivity panel is negative Serologies are positive for borderline elevation CCP, double-stranded DNA and anticardiolipin antibody.  No evidence of connective tissue disease per rheumatology consult. It is possible that the ILD is the first manifestation of connective tissue disease.  We will continue to monitor this closely with the help of rheumatology. For now we will treat this as IPF and use anti-fibrotics.    Started on Ofev in early June 18.  Decided against esbriet as she has history of piebaldism and tends to sunburn easily. Continue monitoring LFTs,  Defer pulmonary rehab as she is too weak to complete therapy. Pt declined pulm rehab   Emphysema Family history of alpha-1 antitrypsin deficiency MZ phenotype  She has moderate emphysema on CT  scan with no obstruction on PFTs. A1AT levels are normal.  There is no indication at present for replacement therapy. Continue Dulera inhaler, albuterol as needed Supplemental oxygen   GERD She has significant issues with GERD, acid reflux. Reports food gets stuck at times while swallowing.  Continue on omeprazole 40 mg once daily. Consider GI eval with EGD, manometery.    Goals of care Discussed goals of care with Mrs. Amber Lloyd and her son.  They would want full life support on a temporary basis.    Health maintenance 02/28/2017-influenza, repeat flu test today.  11/18/2017-Pneumovax  Plan/Recommendations: - Continue ofev - Follow LFTs - Continue Dulera inhaler, continue albuterol, supplemental O2, omeprazole  Chilton Greathouse MD [facility] 02/13/2018, 9:55 AM  CC: Jacquelin Hawking, PA-C

## 2018-02-13 NOTE — Addendum Note (Signed)
Addended by: Trellis Paganini D on: 02/13/2018 10:30 AM   Modules accepted: Orders

## 2018-02-14 NOTE — Telephone Encounter (Signed)
LMTCB

## 2018-02-17 ENCOUNTER — Telehealth: Payer: Self-pay | Admitting: Pulmonary Disease

## 2018-02-17 NOTE — Telephone Encounter (Signed)
Spoke with Dr Isaiah Serge - the hypercoag panel is indeed a mistake, was only to be the LFT.  I spoke with the lab manager Aram Beecham this morning and labs are to be credited so no charge to patient  Also, Per Dr Isaiah Serge: LFTs were normal

## 2018-02-17 NOTE — Telephone Encounter (Signed)
Patient returned call, CB is [phone] or [phone]. She states we called and left message within the last hour? There is also another message open as well that she called dated 02/17/18.

## 2018-02-17 NOTE — Telephone Encounter (Signed)
Spoke with patient. She is aware of Dr. Shirlee More response. She verbalized understanding. Nothing further needed at time of call.

## 2018-02-17 NOTE — Telephone Encounter (Signed)
ATC the number provided in the message - that is not a working number [facility] TCB x3 - will leave message open since unsure what number the previous messages were left on  Upon inspection of patient's chart Dr Isaiah Serge ordered a Hepatic Function Panel This was drawn, but so was a hypercoag panel that was originally ordered in Feb 2019  Have spoken with Aram Beecham in the lab Emergency planning/management officer) who stated that she will research this and will credit patient if needed  In the meantime, will route to Dr Isaiah Serge to inquire if this hypercoag panel was a verbal add-on or not - please advise, thank you.  Patient Instructions  We will give you a flu vaccine today We will check hepatic function panel today Follow-up in 1 to 2 months with nurse practitioner

## 2018-02-17 NOTE — Telephone Encounter (Signed)
See phone note from 02/14/18.

## 2018-02-17 NOTE — Telephone Encounter (Signed)
Spoke with patient. She stated that she was calling back due to her labs that were collected on 02/13/18. Advised her that we are still waiting on a response from Dr. Isaiah Serge in regards to the hypercoagulable panel. Advised her that we would call her as soon as we get a response, she verbalized understanding.   Dr. Isaiah Serge, please advise on the hypercoagulable panel that was ordered.

## 2018-02-22 LAB — HYPERCOAGULABLE PANEL, COMPREHENSIVE
ACT. PRT C RESIST W/FV DEFIC.: 2.8 ratio
ANTICARDIOLIPIN AB, IGM: 42 [MPL'U] — AB
APTT: 28.2 s
AT III Act/Nor PPP Chro: 106 %
Anticardiolipin Ab, IgG: <10 [GPL'U]
Beta-2 Glycoprotein I, IgA: <10 SAU
Beta-2 Glycoprotein I, IgG: <10 SGU
Beta-2 Glycoprotein I, IgM: <10 SMU
DRVVT Screen Seconds: 41.9 s
Factor VII Antigen**: 134 %
Factor VIII Activity: 189 % — ABNORMAL HIGH
HEXAGONAL PHOSPHOLIPID NEUTRAL: 2 s
HOMOCYSTEINE: 7.8 umol/L
PROT C AG ACT/NOR PPP IMM: 109 %
PROTEIN S AG/FVII AG RATIO: 0.8 ratio
Prot S Ag Act/Nor PPP Imm: 110 %
Protein C Ag/FVII Ag Ratio**: 0.8 ratio

## 2018-04-15 ENCOUNTER — Ambulatory Visit (INDEPENDENT_AMBULATORY_CARE_PROVIDER_SITE_OTHER): Admitting: Nurse Practitioner

## 2018-04-15 ENCOUNTER — Encounter: Payer: Self-pay | Admitting: Nurse Practitioner

## 2018-04-15 VITALS — BP 98/64 | HR 79 | Ht 64.0 in | Wt 168.4 lb

## 2018-04-15 DIAGNOSIS — J439 Emphysema, unspecified: Secondary | ICD-10-CM | POA: Diagnosis not present

## 2018-04-15 DIAGNOSIS — J0100 Acute maxillary sinusitis, unspecified: Secondary | ICD-10-CM | POA: Diagnosis not present

## 2018-04-15 MED ORDER — PREDNISONE 10 MG PO TABS: ORAL_TABLET | ORAL | 0 refills | Status: DC

## 2018-04-15 MED ORDER — AMOXICILLIN-POT CLAVULANATE 875-125 MG PO TABS: 1.0000 | ORAL_TABLET | Freq: Two times a day (BID) | ORAL | 0 refills | Status: DC

## 2018-04-15 NOTE — Assessment & Plan Note (Signed)
Continue stiolto and Proventil Will order prednisone taper

## 2018-04-15 NOTE — Patient Instructions (Signed)
Acute Sinusitis:  Will order Augmentin Will order prednisone taper Samples of mucinex given Samples of delsym given Continue current inhalers - stiolto, albuterol   General instructions:  Hydrate  Drink enough water to keep your pee (urine) clear or pale yellow. Rest  Rest as much as possible.  Sleep with your head raised (elevated).  Make sure to get enough sleep each night. General instructions  Put a warm, moist washcloth on your face 3-4 times a day or as told by your doctor. This will help with discomfort.  Wash your hands often with soap and water. If there is no soap and water, use hand sanitizer.  Do not smoke. Avoid being around people who are smoking (secondhand smoke). Call the office if:  You have a fever.  Your symptoms get worse.  Your symptoms do not get better within 10 days.  Follow up: Please follow up with Dr. Isaiah Serge in 2 weeks or sooner if needed

## 2018-04-15 NOTE — Assessment & Plan Note (Signed)
Patient Instructions  Acute Sinusitis:  Will order Augmentin Will order prednisone taper Samples of mucinex given Samples of delsym given Continue current inhalers - stiolto, albuterol   General instructions:  Hydrate  Drink enough water to keep your pee (urine) clear or pale yellow. Rest  Rest as much as possible.  Sleep with your head raised (elevated).  Make sure to get enough sleep each night. General instructions  Put a warm, moist washcloth on your face 3-4 times a day or as told by your doctor. This will help with discomfort.  Wash your hands often with soap and water. If there is no soap and water, use hand sanitizer.  Do not smoke. Avoid being around people who are smoking (secondhand smoke). Call the office if:  You have a fever.  Your symptoms get worse.  Your symptoms do not get better within 10 days.  Follow up: Please follow up with Amber Lloyd in 2 weeks or sooner if needed

## 2018-04-15 NOTE — Progress Notes (Deleted)
@Patient  ID: , female    DOB: February 22, 1956, 62 y.o.   MRN: [id number]  No chief complaint on file.   Referring provider: No ref. provider found  HPI:  62 year old female patient followed in our office for interstitial lung disease, probable IPF, currently on 0FEV  PMH:  Smoker/ Smoking History: Former smoker.  34-pack-year smoking history. Maintenance:  OFEV, Dulera? Stiolto? Pt of: Dr. 63   Recent [facility] Encounters:   02/13/2018-office visit-[place] Patient declined pulmonary rehab.  Patient declined referral to [facility] for lung transplant.  We will continue 05.  Continue Dulera inhaler.,  Continue omeprazole 40 mg daily, LFTs today  04/15/2018  - Visit   HPI  Tests:   Imaging Chest x-ray 03/01/08-  Clear lungs, no acute cardiopulmonary disease. Chest x-ray 05/13/17-bilateral interstitial opacities, more at the bases.  New from 2009. High-resolution CT chest 06/25/17-reticular opacities, bronchiolectasis with basal gradient.  No honeycombing.  Moderate centrilobular and paraseptal emphysema. High-resolution CT 12/24/2017-mild progression of pulmonary fibrosis Reviewed the images personally  PFTs 06/25/17 FVC 2.37 [33%], FEV1 1.97 [5 9%], F/F 83, TLC 75%, DLCO 20% Mild restriction with severe diffusion defect.  11/21/2017 FVC 2.31 [71%), FEV1 1.88 [476%], DLCO 25% Mild restriction, severe diffusion impairment  6-minute walk 12/24/2017 145 m, post exercise-87% on 2 L  Labs Alpha-1 antitrypsin 02/16/09-PIMZ, 114 mg/dL 02/18/09- 1/94/17 mg/dL  CBC 408 6.7, eosinophils 6%, absolute eosinophil count 400 LFTs 11/19/2017-within normal limits.  Connective tissue serologies 07/05/17-ANA, ANCA, Ro, La, scl 70 negative, aldolase 3.6 CCP 20, double-stranded DNA 7, anticardiolipin antibody 49 Rheumatoid factor < 14 Hypersensitivity panel-negative   FENO:  No results found for: NITRICOXIDE  PFT: PFT Results Latest Ref Rng & Units 11/21/2017 06/25/2017   FVC-Pre L 2.31 2.23  FVC-Predicted Pre % 71 69  FVC-Post L - 2.37  FVC-Predicted Post % - 73  Pre FEV1/FVC % % 82 84  Post FEV1/FCV % % - 83  FEV1-Pre L 1.88 1.87  FEV1-Predicted Pre % 76 75  FEV1-Post L - 1.97  DLCO UNC% % 25 20  DLCO COR %Predicted % 42 33  TLC L - 3.79  TLC % Predicted % - 75  RV % Predicted % - 67    Imaging: No results found.  Chart Review:    Specialty Problems      Pulmonary Problems   Pulmonary emphysema (HCC)   Interstitial pulmonary disease (HCC)   IPF (idiopathic pulmonary fibrosis) (HCC)      No Known Allergies  Immunization History  Administered Date(s) Administered  . Influenza,inj,Quad PF,6+ Mos 02/28/2017, 02/13/2018  . Pneumococcal Polysaccharide-23 11/18/2017    Past Medical History:  Diagnosis Date  . Anemia   . Anxiety   . Depression   . GERD (gastroesophageal reflux disease)   . IBS (irritable bowel syndrome)   . ILD (interstitial lung disease) (HCC)     Tobacco History: Social History   Tobacco Use  Smoking Status Former Smoker  . Packs/day: 1.00  . Years: 34.00  . Pack years: 34.00  . Types: Cigarettes  . Last attempt to quit: 07/27/2007  . Years since quitting: 10.7  Smokeless Tobacco Never Used   Counseling given: Not Answered   Outpatient Encounter Medications as of 04/15/2018  Medication Sig  . albuterol (PROVENTIL) (2.5 MG/3ML) 0.083% nebulizer solution Take 3 mLs (2.5 mg total) by nebulization every 6 (six) hours as needed for wheezing or shortness of breath.  . clonazePAM (KLONOPIN) 1 MG tablet Take 1 mg by mouth  at bedtime.  . Ibuprofen (ADVIL PO) Take 1 tablet by mouth daily as needed. For shoulder pain and headaches   . IRON PO Take 1 tablet by mouth daily.  . Multiple Vitamin (MULTIVITAMIN) capsule Take 1 capsule by mouth daily.  . Nintedanib (OFEV) 150 MG CAPS Take 150 mg by mouth 2 (two) times daily.  Marland Kitchen omeprazole (PRILOSEC) 40 MG capsule Take 1 capsule (40 mg total) by mouth daily.  .  OXYGEN Inhale into the lungs.  Marland Kitchen PROVENTIL HFA 108 (90 Base) MCG/ACT inhaler INHALE 2 PUFFS BY MOUTH EVERY 6 HOURS AS NEEDED FOR COUGHING, WHEEZING, OR SHORTNESS OF BREATH  . sertraline (ZOLOFT) 50 MG tablet Take 100 mg by mouth daily.   . Tiotropium Bromide-Olodaterol (STIOLTO RESPIMAT) 2.5-2.5 MCG/ACT AERS Inhale 2 puffs into the lungs daily.  . Tiotropium Bromide-Olodaterol (STIOLTO RESPIMAT) 2.5-2.5 MCG/ACT AERS Inhale 2 puffs into the lungs daily.   No facility-administered encounter medications on file as of 04/15/2018.      Review of Systems  Review of Systems   Physical Exam  There were no vitals taken for this visit.  Wt Readings from Last 5 Encounters:  02/13/18 166 lb 9.6 oz (75.6 kg)  12/26/17 168 lb 11.2 oz (76.5 kg)  12/24/17 169 lb (76.7 kg)  11/18/17 168 lb (76.2 kg)  09/25/17 168 lb (76.2 kg)     Physical Exam    Lab Results:  CBC    Component Value Date/Time   WBC 6.7 05/13/2017 1432   RBC 4.75 05/13/2017 1432   HGB 15.0 05/13/2017 1432   HCT 46.5 (H) 05/13/2017 1432   PLT 187 05/13/2017 1432   MCV 97.9 05/13/2017 1432   MCH 31.6 05/13/2017 1432   MCHC 32.3 05/13/2017 1432   RDW 13.0 05/13/2017 1432   LYMPHSABS 1.8 05/13/2017 1432   MONOABS 0.6 05/13/2017 1432   EOSABS 0.4 05/13/2017 1432   BASOSABS 0.0 05/13/2017 1432    BMET    Component Value Date/Time   NA 144 11/19/2017 1205   K 3.9 11/19/2017 1205   CL 108 11/19/2017 1205   CO2 27 11/19/2017 1205   GLUCOSE 96 11/19/2017 1205   BUN 6 (L) 11/19/2017 1205   CREATININE 0.67 11/19/2017 1205   CREATININE 0.70 01/17/2016 0854   CALCIUM 8.9 11/19/2017 1205   GFRNONAA >60 11/19/2017 1205   GFRAA >60 11/19/2017 1205    BNP No results found for: BNP  ProBNP No results found for: PROBNP    Assessment & Plan:     No problem-specific Assessment & Plan notes found for this encounter.     Coral Ceo, NP 04/15/2018

## 2018-04-15 NOTE — Progress Notes (Signed)
@Patient  ID: , female    DOB: Oct 21, 1955, 62 y.o.   MRN: [id number]  Chief Complaint  Patient presents with  . Nasal Congestion    Referring provider: [facility]  HPI 62 year old with idiopathic pulmonary fibrosis (on ofev since June 2019) followed by Dr. July 2019. PMH includes allergies, anemia, depression, irritable bowel syndrome.   Tests:  Imaging Chest x-ray 03/01/08-  Clear lungs, no acute cardiopulmonary disease. Chest x-ray 05/13/17-bilateral interstitial opacities, more at the bases.  New from 2009. High-resolution CT chest 06/25/17-reticular opacities, bronchiolectasis with basal gradient.  No honeycombing.  Moderate centrilobular and paraseptal emphysema. High-resolution CT 12/24/2017-mild progression of pulmonary fibrosis Reviewed the images personally  PFTs 06/25/17 FVC 2.37 [33%], FEV1 1.97 [5 9%], F/F 83, TLC 75%, DLCO 20% Mild restriction with severe diffusion defect.  11/21/2017 FVC 2.31 [71%), FEV1 1.88 [476%], DLCO 25% Mild restriction, severe diffusion impairment  6-minute walk 12/24/2017 145 m, post exercise-87% on 2 L  Labs Alpha-1 antitrypsin 02/16/09-PIMZ, 114 mg/dL 02/18/09- 02/14/15 mg/dL  CBC 606 6.7, eosinophils 6%, absolute eosinophil count 400 LFTs 11/19/2017-within normal limits.  Connective tissue serologies 07/05/17-ANA, ANCA, Ro, La, scl 70 negative, aldolase 3.6 CCP 20, double-stranded DNA 7, anticardiolipin antibody 49 Rheumatoid factor < 14 Hypersensitivity panel-negative    OV 04/15/18 - acute sinus pressure and pain Patient presents with sinus pressure and pain. States that symptoms started 3 weeks ago and have progressively worsened. States that she has been more short of breath. Is now using 3L [place] with exertion instead of 2L that she usually uses with exertion. She states that she has post nasal drip and cough productive of yellow sputum. States that sinus pressure and nasal congestion has been  worse on the right side. Denies any chest pain, fever, or edema. She is compliant with Stiolto and Proventil.     No Known Allergies  Immunization History  Administered Date(s) Administered  . Influenza,inj,Quad PF,6+ Mos 02/28/2017, 02/13/2018  . Pneumococcal Polysaccharide-23 11/18/2017    Past Medical History:  Diagnosis Date  . Anemia   . Anxiety   . Depression   . GERD (gastroesophageal reflux disease)   . IBS (irritable bowel syndrome)   . ILD (interstitial lung disease) (HCC)     Tobacco History: Social History   Tobacco Use  Smoking Status Former Smoker  . Packs/day: 1.00  . Years: 34.00  . Pack years: 34.00  . Types: Cigarettes  . Last attempt to quit: 07/27/2007  . Years since quitting: 10.7  Smokeless Tobacco Never Used   Counseling given: Yes   Outpatient Encounter Medications as of 04/15/2018  Medication Sig  . albuterol (PROVENTIL) (2.5 MG/3ML) 0.083% nebulizer solution Take 3 mLs (2.5 mg total) by nebulization every 6 (six) hours as needed for wheezing or shortness of breath.  . clonazePAM (KLONOPIN) 1 MG tablet Take 1 mg by mouth at bedtime.  . Ibuprofen (ADVIL PO) Take 1 tablet by mouth daily as needed. For shoulder pain and headaches   . IRON PO Take 1 tablet by mouth daily.  . Multiple Vitamin (MULTIVITAMIN) capsule Take 1 capsule by mouth daily.  . Nintedanib (OFEV) 150 MG CAPS Take 150 mg by mouth 2 (two) times daily.  04/17/2018 omeprazole (PRILOSEC) 40 MG capsule Take 1 capsule (40 mg total) by mouth daily.  . OXYGEN Inhale into the lungs.  Marland Kitchen PROVENTIL HFA 108 (90 Base) MCG/ACT inhaler INHALE 2 PUFFS BY MOUTH EVERY 6 HOURS AS NEEDED FOR COUGHING, WHEEZING, OR SHORTNESS  OF BREATH  . sertraline (ZOLOFT) 50 MG tablet Take 100 mg by mouth daily.   . Tiotropium Bromide-Olodaterol (STIOLTO RESPIMAT) 2.5-2.5 MCG/ACT AERS Inhale 2 puffs into the lungs daily.  Marland Kitchen amoxicillin-clavulanate (AUGMENTIN) 875-125 MG tablet Take 1 tablet by mouth 2 (two) times daily.  .  predniSONE (DELTASONE) 10 MG tablet Take 4 tabs for 2 days, then 3 tabs for 2 days, then 2 tabs for 2 days, then 1 tab for 2 days, then stop  . [DISCONTINUED] Tiotropium Bromide-Olodaterol (STIOLTO RESPIMAT) 2.5-2.5 MCG/ACT AERS Inhale 2 puffs into the lungs daily.   No facility-administered encounter medications on file as of 04/15/2018.      Review of Systems  Review of Systems  Constitutional: Negative.  Negative for chills and fever.  HENT: Positive for congestion, postnasal drip, sinus pressure and sinus pain.   Respiratory: Positive for cough and shortness of breath. Negative for wheezing.   Cardiovascular: Negative.  Negative for chest pain, palpitations and leg swelling.  Gastrointestinal: Negative.   Allergic/Immunologic: Negative.   Neurological: Negative.   Psychiatric/Behavioral: Negative.        Physical Exam  BP 98/64 (BP Location: Left Arm, Cuff Size: Normal)   Pulse 79   Ht 5\' 4"  (1.626 m)   Wt 168 lb 6.4 oz (76.4 kg)   SpO2 95%   BMI 28.91 kg/m   Wt Readings from Last 5 Encounters:  04/15/18 168 lb 6.4 oz (76.4 kg)  02/13/18 166 lb 9.6 oz (75.6 kg)  12/26/17 168 lb 11.2 oz (76.5 kg)  12/24/17 169 lb (76.7 kg)  11/18/17 168 lb (76.2 kg)     Physical Exam  Constitutional: She is oriented to person, place, and time. She appears well-developed and well-nourished. No distress.  HENT:  Nose: Right sinus exhibits maxillary sinus tenderness and frontal sinus tenderness. Left sinus exhibits maxillary sinus tenderness and frontal sinus tenderness.  Cardiovascular: Normal rate and regular rhythm.  Pulmonary/Chest: Effort normal and breath sounds normal. No respiratory distress. She has no wheezes. She has no rales.  Musculoskeletal: She exhibits no edema.  Neurological: She is alert and oriented to person, place, and time.  Psychiatric: She has a normal mood and affect.  Nursing note and vitals reviewed.      Assessment & Plan:   Pulmonary emphysema  (HCC) Continue stiolto and Proventil Will order prednisone taper   Acute non-recurrent maxillary sinusitis Patient Instructions  Acute Sinusitis:  Will order Augmentin Will order prednisone taper Samples of mucinex given Samples of delsym given Continue current inhalers - stiolto, albuterol   General instructions:  Hydrate  Drink enough water to keep your pee (urine) clear or pale yellow. Rest  Rest as much as possible.  Sleep with your head raised (elevated).  Make sure to get enough sleep each night. General instructions  Put a warm, moist washcloth on your face 3-4 times a day or as told by your doctor. This will help with discomfort.  Wash your hands often with soap and water. If there is no soap and water, use hand sanitizer.  Do not smoke. Avoid being around people who are smoking (secondhand smoke). Call the office if:  You have a fever.  Your symptoms get worse.  Your symptoms do not get better within 10 days.  Follow up: Please follow up with Dr. 11/20/17 in 2 weeks or sooner if needed         Isaiah Serge, NP 04/15/2018

## 2018-04-30 ENCOUNTER — Ambulatory Visit (INDEPENDENT_AMBULATORY_CARE_PROVIDER_SITE_OTHER): Admitting: Pulmonary Disease

## 2018-04-30 ENCOUNTER — Encounter: Payer: Self-pay | Admitting: Pulmonary Disease

## 2018-04-30 VITALS — BP 106/64 | HR 97 | Ht 64.0 in | Wt 166.8 lb

## 2018-04-30 DIAGNOSIS — Z5181 Encounter for therapeutic drug level monitoring: Secondary | ICD-10-CM

## 2018-04-30 DIAGNOSIS — J84112 Idiopathic pulmonary fibrosis: Secondary | ICD-10-CM

## 2018-04-30 LAB — COMPREHENSIVE METABOLIC PANEL
ALBUMIN: 3.9 g/dL (ref 3.5–5.2)
ALK PHOS: 58 U/L (ref 39–117)
ALT: 17 U/L (ref 0–35)
AST: 14 U/L (ref 0–37)
BILIRUBIN TOTAL: 0.7 mg/dL (ref 0.2–1.2)
BUN: 10 mg/dL (ref 6–23)
CALCIUM: 9 mg/dL (ref 8.4–10.5)
CO2: 28 meq/L (ref 19–32)
CREATININE: 0.67 mg/dL (ref 0.40–1.20)
Chloride: 106 mEq/L (ref 96–112)
GFR: 94.21 mL/min (ref >60.00)
Glucose, Bld: 87 mg/dL (ref 70–99)
Potassium: 3.9 mEq/L (ref 3.5–5.1)
Sodium: 142 mEq/L (ref 135–145)
TOTAL PROTEIN: 6.8 g/dL (ref 6.0–8.3)

## 2018-04-30 LAB — CBC WITH DIFFERENTIAL/PLATELET
BASOS ABS: 0 10*3/uL (ref 0.0–0.1)
Basophils Relative: 0.3 % (ref 0.0–3.0)
Eosinophils Absolute: 0.2 10*3/uL (ref 0.0–0.7)
Eosinophils Relative: 3.6 % (ref 0.0–5.0)
HCT: 41 % (ref 36.0–46.0)
HEMOGLOBIN: 13.9 g/dL (ref 12.0–15.0)
Lymphocytes Relative: 22.6 % (ref 12.0–46.0)
Lymphs Abs: 1.5 10*3/uL (ref 0.7–4.0)
MCHC: 33.8 g/dL (ref 30.0–36.0)
MCV: 99.2 fl (ref 78.0–100.0)
MONOS PCT: 9.6 % (ref 3.0–12.0)
Monocytes Absolute: 0.6 10*3/uL (ref 0.1–1.0)
NEUTROS ABS: 4.2 10*3/uL (ref 1.4–7.7)
Neutrophils Relative %: 63.9 % (ref 43.0–77.0)
PLATELETS: 140 10*3/uL — AB (ref 150.0–400.0)
RBC: 4.14 Mil/uL (ref 3.87–5.11)
RDW: 12.8 % (ref 11.5–15.5)
WBC: 6.6 10*3/uL (ref 4.0–10.5)

## 2018-04-30 NOTE — Progress Notes (Signed)
Amber Lloyd    [id number]    [birth year]  Primary Care Physician:[facility]  Referring Physician: [facility] [address] [place], [place] [address]  Chief complaint: Follow up for IPF, on ofev since June 2019  HPI: 62 year old with history of allergies, anemia, depression, irritable bowel syndrome.  She has complaints of dyspnea on exertion for many months.  This worsened in mid December associated with dizziness and presyncope.  She is evaluated by primary care who gave her prednisone and nebulizer with mild improvement.  Still has ongoing symptoms associated with cough, yellow mucus.  Denies any fevers, chills and she had a chest x-ray in December which shows interstitial opacities and has been referred to pulmonary for further evaluation.  Family history significant for alpha-1 antitrypsin deficiency.  Evaluated in 2010 with MZ phenotype and normal levels.  Labs noted for mild elevation in CCP. She has been seen by Dr. Allena Katz, rheumatology at [facility] who feels there is no evidence of rheumatoid arthritis.  Referred to transplant at [facility].  Patient has decided not to follow-up with [place] since she would need significant psychosocial support and she does not want her family to be inconvenienced. She has been referred to pulmonary rehab but could not start due to weakness.  Pets: 3 cats, 4 dogs, turtle, fish, frog.  No birds or farm animals Occupation: Facilities manager for patients with dementia Exposures: Has leaky basement, possible mold.  She was employed in a [employer] for 3 years in 1970s and was exposed to fiber at that time. Smoking history: 30-pack-year smoking history.  Quit in 2009 Travel History: Not significant  Interim history: Seen by nurse practitioner on 11/19 for acute sinus pressure and pain.  Treated with Augmentin and prednisone taper for maxillary sinusitis.  On Ofev.  She has diarrhea with this and is using  loperamide Continues to have significant dyspnea, fatigue with good days and bad days.  Outpatient Encounter Medications as of 04/30/2018  Medication Sig  . albuterol (PROVENTIL) (2.5 MG/3ML) 0.083% nebulizer solution Take 3 mLs (2.5 mg total) by nebulization every 6 (six) hours as needed for wheezing or shortness of breath.  . clonazePAM (KLONOPIN) 1 MG tablet Take 1 mg by mouth at bedtime.  . Ibuprofen (ADVIL PO) Take 1 tablet by mouth daily as needed. For shoulder pain and headaches   . IRON PO Take 1 tablet by mouth daily.  . Multiple Vitamin (MULTIVITAMIN) capsule Take 1 capsule by mouth daily.  . Nintedanib (OFEV) 150 MG CAPS Take 150 mg by mouth 2 (two) times daily.  Marland Kitchen omeprazole (PRILOSEC) 40 MG capsule Take 1 capsule (40 mg total) by mouth daily.  . OXYGEN Inhale into the lungs.  Marland Kitchen PROVENTIL HFA 108 (90 Base) MCG/ACT inhaler INHALE 2 PUFFS BY MOUTH EVERY 6 HOURS AS NEEDED FOR COUGHING, WHEEZING, OR SHORTNESS OF BREATH  . sertraline (ZOLOFT) 50 MG tablet Take 100 mg by mouth daily.   . Tiotropium Bromide-Olodaterol (STIOLTO RESPIMAT) 2.5-2.5 MCG/ACT AERS Inhale 2 puffs into the lungs daily.  . [DISCONTINUED] amoxicillin-clavulanate (AUGMENTIN) 875-125 MG tablet Take 1 tablet by mouth 2 (two) times daily.  . [DISCONTINUED] predniSONE (DELTASONE) 10 MG tablet Take 4 tabs for 2 days, then 3 tabs for 2 days, then 2 tabs for 2 days, then 1 tab for 2 days, then stop   No facility-administered encounter medications on file as of 04/30/2018.    Physical Exam: Blood pressure 106/64,  pulse 97, height 5\' 4"  (1.626 m), weight 166 lb 12.8 oz (75.7 kg), SpO2 94 %. Gen:      No acute distress HEENT:  EOMI, sclera anicteric Neck:     No masses; no thyromegaly Lungs:    Bibasilar crackles. CV:         Regular rate and rhythm; no murmurs Abd:      + bowel sounds; soft, non-tender; no palpable masses, no distension Ext:    No edema; adequate peripheral perfusion Skin:      Warm and dry; no  rash Neuro: alert and oriented x 3 Psych: normal mood and affect  Data Reviewed: Imaging Chest x-ray 03/01/08-  Clear lungs, no acute cardiopulmonary disease. Chest x-ray 05/13/17-bilateral interstitial opacities, more at the bases.  New from 2009. High-resolution CT chest 06/25/17-reticular opacities, bronchiolectasis with basal gradient.  No honeycombing.  Moderate centrilobular and paraseptal emphysema. High-resolution CT 12/24/2017-mild progression of pulmonary fibrosis Reviewed the images personally  PFTs 06/25/17 FVC 2.37 [33%], FEV1 1.97 [5 9%], F/F 83, TLC 75%, DLCO 20% Mild restriction with severe diffusion defect.  11/21/2017 FVC 2.31 [71%), FEV1 1.88 [476%], DLCO 25% Mild restriction, severe diffusion impairment  6-minute walk 12/24/2017 145 m, post exercise-87% on 2 L  Labs Alpha-1 antitrypsin 02/16/09-PIMZ, 114 mg/dL 02/18/09- 9/67/89 mg/dL  CBC 381 6.7, eosinophils 6%, absolute eosinophil count 400 LFTs 11/19/2017-within normal limits.  Connective tissue serologies 07/05/17-ANA, ANCA, Ro, La, scl 70 negative, aldolase 3.6 CCP 20, double-stranded DNA 7, anticardiolipin antibody 49 Rheumatoid factor < 14 Hypersensitivity panel-negative   Assessment:  IPF CT scan reviewed which shows fibrosis in probable UIP pattern.  There is no honeycombing She does not have any significant exposures except for possible mold in basement.  Hypersensitivity panel is negative Serologies are positive for borderline elevation CCP, double-stranded DNA and anticardiolipin antibody.  No evidence of connective tissue disease per rheumatology consult. It is possible that the ILD is the first manifestation of connective tissue disease.  We will continue to monitor this closely with the help of rheumatology. For now we will treat this as IPF and use anti-fibrotics.    Started on Ofev in early June 18.  Decided against esbriet as she has history of piebaldism and tends to sunburn easily. Continue  monitoring LFTs,  Defer pulmonary rehab as she is too weak to complete therapy and pt declined pulm rehab   Emphysema Family history of alpha-1 antitrypsin deficiency MZ phenotype  She has moderate emphysema on CT scan with no obstruction on PFTs. A1AT levels are normal.  There is no indication at present for replacement therapy. Continue Dulera inhaler, albuterol as needed Supplemental oxygen   GERD, diarrhea She has significant issues with GERD, acid reflux. Reports food gets stuck at times while swallowing.  Continue on omeprazole 40 mg once daily.  Has diarrhea from ofev.  Loperamide for symptom management.  Advised to try lactose-free diet.  Goals of care Discussed goals of care with Mrs. Illes and her son.  They would want full life support on a temporary basis.   She will need significant help due to declining respiratory status and frailty with fatigue We will try to work through June 20 [place] to get home health care and palliative care for symptom management Try to arrange wheelchair to help with mobility.  Health maintenance 02/13/2018-influenza 11/18/2017-Pneumovax  Plan/Recommendations: - Continue ofev - LFTs today - Continue stiolto inhaler, continue albuterol, supplemental O2 - Omeprazole, loperamide, lactose-free diet. - Arrange for wheelchair and home health, palliative  care through [place].  Chilton Greathouse MD [facility] 04/30/2018, 9:32 AM  CC: [facility]

## 2018-04-30 NOTE — Patient Instructions (Signed)
Continue the ofev Recheck some labs today including comprehensive metabolic panel, CBC  [facility] with [facility] to see if we can get a wheelchair for you We will also try to arrange home health care coverage and palliative care referral if this is covered by your current insurance Follow-up in 3 months.

## 2018-05-01 ENCOUNTER — Telehealth: Payer: Self-pay | Admitting: Pulmonary Disease

## 2018-05-01 NOTE — Telephone Encounter (Signed)
Called and spoke to patient, made aware of lab results. Voiced understanding. Nothing further is needed at this time.  

## 2018-05-07 ENCOUNTER — Telehealth: Payer: Self-pay | Admitting: Pulmonary Disease

## 2018-05-07 DIAGNOSIS — J84112 Idiopathic pulmonary fibrosis: Secondary | ICD-10-CM

## 2018-05-07 NOTE — Telephone Encounter (Signed)
Called patient, unable to reach left message to give us a call back. 

## 2018-05-08 NOTE — Telephone Encounter (Signed)
I have placed order for home health to provide palliative care. Called patient unable to reach left message to give [place] a call back.

## 2018-05-08 NOTE — Telephone Encounter (Signed)
Referral is for palliative care and symptom management. Not hospice Patient is not ready for hospice  If we cannot provide only palliative care then can we arrange for home health?

## 2018-05-08 NOTE — Telephone Encounter (Signed)
Faxed to [facility] & rec'd fax confirmation.

## 2018-05-08 NOTE — Telephone Encounter (Signed)
[facility] [phone] ext. [phone]); would like to speak to nurse for clarification; what type of services/treatments pt is needing

## 2018-05-08 NOTE — Telephone Encounter (Signed)
ATC Amber Lloyd to talk about what clarification she is needing for the referral will await a call back.

## 2018-05-08 NOTE — Telephone Encounter (Signed)
Spoke with patient [facility] and he stated she didn't not need to [facility] to [facility] and Palliative care order  Need to go to a DME company and hospice and palliative care company.   [facility] please send these orders Thank you!

## 2018-05-08 NOTE — Telephone Encounter (Signed)
Hospice is wondering if she is going to continue the ofev  medication since it is a curative medication. They would also like to know if the patient is going to need palliative or Hospice care ? [facility] only does hospice not palliative care   The next question is if  Dr. Isaiah Serge would be the over seeing Physician?  The also wanted to make [place] aware they only do standard wheel chairs not electric ones.    Dr Isaiah Serge please advise

## 2018-05-09 NOTE — Telephone Encounter (Signed)
Call made to patient, made aware her orders have been update for her to receive home health for symptom management as Dr. Isaiah Serge does not feel she is ready for Hospice. Patient voices understanding. Call made to [facility] to make aware as well. Shes states she spoke with [facility] as long s chair is less than $2000 they will not need to do a PA. Patient would like to know whether or not we can placed order to get a motorized wheelchair to a DME company?  PM please advise?

## 2018-05-12 ENCOUNTER — Telehealth: Payer: Self-pay | Admitting: Pulmonary Disease

## 2018-05-12 NOTE — Telephone Encounter (Signed)
Called and spoke with Cassandra, she is aware and verbalized understanding. Nothing further needed.

## 2018-05-12 NOTE — Telephone Encounter (Signed)
Called Amber Lloyd, unable to reach left message to give [place] a call back.

## 2018-05-12 NOTE — Telephone Encounter (Signed)
Called and spoke with patient, advised her that I have placed an order for the wheelchair. Nothing further needed.

## 2018-05-12 NOTE — Telephone Encounter (Signed)
I have not received any forms..

## 2018-05-12 NOTE — Telephone Encounter (Signed)
Called and spoke with Cassandra and she stated that they will need to know if the patient will continue to take the [facility] or if she will be discontinuing it. She also will need to know if Dr. Isaiah Serge will be the attending physician. They cannot provide patient with a motorized wheelchair as they can only do regular wheelchairs.   Dr. Isaiah Serge please advise, thank you.

## 2018-05-12 NOTE — Telephone Encounter (Signed)
I have looked in the boxes and do not see forms for evaluations. Margie do you have these forms. Thank you.

## 2018-05-12 NOTE — Telephone Encounter (Signed)
Ok to place an order for motorized wheelchair if the insurance will cover.

## 2018-05-12 NOTE — Telephone Encounter (Signed)
We have canceled the referral to hospice. Thanks

## 2018-05-13 ENCOUNTER — Telehealth: Payer: Self-pay | Admitting: Pulmonary Disease

## 2018-05-13 NOTE — Telephone Encounter (Addendum)
Spoke with pt, she states she hasn't heard anything from [facility] about her wheelchair. I advised her that they just received the order today at around 3pm. Pt understood and nothing further is needed. I advised her call us back if she still has not heard from them.   Referral Notes  Number of Notes: 4  Type Date User Summary Attachment  General 05/13/2018 3:03 PM Lanna Poche D - -  Note   Rec'd per Barbara Cower.         Type Date User Summary Attachment  General 05/13/2018 8:36 AM Lanna Poche D - -  Note   Sent to Melissa/Jason w/ [facility].          Type Date User Summary Attachment  General 05/12/2018 1:28 PM Lanna Poche D - -  Note   Waiting on signature.         Type Date User Summary Attachment  Provider Comments 05/12/2018 12:17 PM Cox, Christena Deem, CMA Provider Comments -  Note   Please provide patient with a motorized wheelchair. DME is [facility]. The diagnosis is ILD.

## 2018-05-13 NOTE — Telephone Encounter (Signed)
Called and spoke with Amber Lloyd letting her know that we have not seen the forms that she dropped off. Amber Lloyd stated she did have copies of the forms so she was going to bring them by office again in attn Amber Lloyd. Nothing further needed.

## 2018-05-13 NOTE — Telephone Encounter (Signed)
Margie look out for paperwork in Dr. Shirlee More box.

## 2018-05-13 NOTE — Telephone Encounter (Signed)
Paperwork has been placed in Dr. Mannam's sign folder.  

## 2018-05-14 NOTE — Telephone Encounter (Signed)
Order was originally placed for palliative care. It appears that order was sent for hospice not palliative care. Dr. Isaiah Serge has spoke to hospice and requested palliative care.  Forms that were received were for hospice. Forms have been disgarded, as hospice will now evaluate pt for palliative care.

## 2018-05-14 NOTE — Telephone Encounter (Signed)
Margie, please update [place] when paperwork has been able to be taken care of for pt. Thanks!

## 2018-05-23 ENCOUNTER — Telehealth: Payer: Self-pay | Admitting: Pulmonary Disease

## 2018-05-23 NOTE — Telephone Encounter (Signed)
Called and spoke with patient, she stated that she was told that [facility] has her order for home health palliative care, I do see where the order was placed and confirmed by [facility]. She called [facility] and they stated that they did not have the order. [facility] can you help with this? Thank you.

## 2018-05-23 NOTE — Telephone Encounter (Signed)
Called [facility] office & was told I need to speak to Scarlett Presto with [facility] [phone].  Spoke to [employer].  She states she is just waiting for preauth from [place].  She states she spoke to pt yesterday.  She is going to double check with [facility] office and make sure they have all they need and she will follow back up with pt.  She will call me if anything else is needed.

## 2018-06-03 ENCOUNTER — Telehealth: Payer: Self-pay | Admitting: Pulmonary Disease

## 2018-06-03 NOTE — Telephone Encounter (Signed)
Called [facility] and spoke to [place] - [phone].  She states she spoke to pt yesterday and told her they are not in network with [facility] but that she contacted [facility] and they were going to see if they could take the pt & let her know if they couldn't.  Bambi is going to call [facility] now and check with them & call me back.

## 2018-06-04 ENCOUNTER — Ambulatory Visit (HOSPITAL_COMMUNITY)
Admission: RE | Admit: 2018-06-04 | Discharge: 2018-06-04 | Disposition: A | Discharge status: Home / Self Care | Source: Ambulatory Visit | Attending: Physician Assistant | Admitting: Physician Assistant

## 2018-06-04 ENCOUNTER — Other Ambulatory Visit (HOSPITAL_COMMUNITY): Payer: Self-pay | Admitting: Physician Assistant

## 2018-06-04 DIAGNOSIS — M25512 Pain in left shoulder: Secondary | ICD-10-CM | POA: Insufficient documentation

## 2018-06-04 NOTE — Telephone Encounter (Signed)
I have not heard back from [facility].  I just called & left her a vm to call me back with status.

## 2018-06-04 NOTE — Telephone Encounter (Signed)
Amber Lloyd, Did you receive a call back from [facility]?

## 2018-06-05 NOTE — Telephone Encounter (Signed)
Bambi called me back & states [facility] is out of network with [facility] as well.  She also tried [facility] for [place] & they are out of network as well.  She states that [facility] can not take her because they do not service the [place] area.  The only other companies she is aware of that may can help are [facility] and [facility].  I have called [name] at [place] & left her a vm to call me back

## 2018-06-05 NOTE — Telephone Encounter (Signed)
Angie called me back from [place].  She has checked with home office & feels that they will be able to help pt.  She asked me to send her a community message so she will have pt's info.  Community message sent.  She stated she would contact pt & would call me back if there is a problem.  Called pt's # & spoke to Fredda Hammed who helps take care of pt & brought her to her appt.  I told her [facility] should be contacting pt & if she hasn't heard from them by tomorrow to tell the pt to call me.  Nothing further needed at this time

## 2018-06-09 ENCOUNTER — Telehealth: Payer: Self-pay | Admitting: Pulmonary Disease

## 2018-06-09 DIAGNOSIS — J84112 Idiopathic pulmonary fibrosis: Secondary | ICD-10-CM

## 2018-06-09 NOTE — Telephone Encounter (Signed)
LMTCB for Angela  

## 2018-06-10 NOTE — Telephone Encounter (Signed)
Amber Lloyd is calling back [phone]

## 2018-06-10 NOTE — Telephone Encounter (Signed)
Spoke with Marylene Landngela, she states we may want to change order to home health nursing instead of palliative care. They are confused about the order and didn't know if you wanted a nurse to come out to check on her a couple times a week or if she needed a aid. She states if we change the order to home health nursing they could go out there and then assess what she needs and we can order from there. They do not do palliative care. Dr. Isaiah SergeMannam please advise.     Note   Please evaluate Kateri Mcatricia H Kingsberry for [facility].  Disciplines requested: Nursing Palliative Care  Services to provide: Other:  Palliative Care  Physician to follow patient's care (the person listed here will be responsible for signing ongoing orders): Referring Provider  Requested Start of Care Date: as soon as possible  I certify that this patient is under my care and that I, or a Nurse Practitioner or Physician's Assistant working with me, had a face-to-face encounter that meets the physician face-to-face requirements with patient on 04/30/18. The encounter with the patient was in whole, or in part for the following medical condition(s) which is the primary reason for home health care (List medical condition). Interstitial lung disease, idiopathic pulmonary disease, pulmonary emphysema   Special Instructions: please provide patient with access to electric wheelchair.

## 2018-06-10 NOTE — Telephone Encounter (Signed)
Called Amber Lloyd but there was no answer. LM for her to call back.

## 2018-06-11 NOTE — Telephone Encounter (Signed)
Ok to change order to change the order to home health nursing

## 2018-06-11 NOTE — Telephone Encounter (Signed)
Just spoke to [place] at [place].  She sent order to [facility] & sent all of pt's info to rep Virgina Jock.  He will contact us if anything else is needed.

## 2018-06-11 NOTE — Telephone Encounter (Signed)
Called [name] with [facility] who reported that she was able to find that [facility] accepts patient's insurance and has transferred patient's referral to that organization.  Per [name], she has spoken with Cordelia Pen [facility] regarding this as well so additional documentation from [facility] is to be expected.  Per [name] she will call and speak with patient.  Will sign and forward to Dr Isaiah Serge as Amber Lloyd.

## 2018-06-12 ENCOUNTER — Telehealth: Payer: Self-pay | Admitting: Pulmonary Disease

## 2018-06-12 NOTE — Telephone Encounter (Signed)
Why is there so much confusion about our order? Please get me the number for the Angela from [place] and time I can call and discuss with her

## 2018-06-12 NOTE — Telephone Encounter (Signed)
Called and spoke with Amber Lloyd.  She stated that nothing further was needed, she had what she needed.  Nothing further at this time.

## 2018-06-12 NOTE — Telephone Encounter (Signed)
Amber Lloyd [facility] is calling  [phone] [name] needs an order for hospice of evaluation

## 2018-06-12 NOTE — Telephone Encounter (Signed)
[name] is calling back [phone]

## 2018-06-12 NOTE — Telephone Encounter (Signed)
I continued the thread in the message from 06/09/2018. So we can close this encounter.

## 2018-06-12 NOTE — Telephone Encounter (Signed)
Spoke with [name], he states her insurance is holding her back from getting the care she needs and since pallative care is under the umbrella of hospice, he is wanting to get a hospice referral placed because her insurance will pay for the services she needs. Its an insurance issue. Dr. Isaiah Serge if you want to call [name] call him [phone] -

## 2018-06-12 NOTE — Telephone Encounter (Signed)
Dr. Isaiah SergeMannam can we give order for Hospice evaluatione?

## 2018-06-13 ENCOUNTER — Telehealth: Payer: Self-pay | Admitting: Pulmonary Disease

## 2018-06-13 NOTE — Telephone Encounter (Signed)
I was asked to help with this matter by Cordelia Pen, [facility]. Called and spoke to Adelene Idler at [phone] with [facility]. He stated that he will need an order if okay by Dr. Isaiah Serge that is for hospice evaluation.  He stated his company would evaluate and patient could have palliative care under hospice after evaluation.  The previous order of palliative care doesn't apply. Per Cordelia Pen, the patient's insurance does not cover home health.   Dr. Isaiah Serge wrote order for palliative care for symptom management in December.   Can we write a new order for hospice evaluation?  Routing to app of the day since Dr. Isaiah Serge is on vacation.

## 2018-06-13 NOTE — Telephone Encounter (Signed)
I have never seen this patient.  When is Dr. Isaiah Serge back on vacation?  Per chart review Dr. Isaiah Serge explicitly states the patient is not ready for hospice.  This can be further evaluated by Dr. Isaiah Serge when he returns from vacation for the patient.  We will route to Dr. Isaiah Serge as [name].   Elisha Headland FNP

## 2018-06-16 ENCOUNTER — Telehealth: Payer: Self-pay | Admitting: Pulmonary Disease

## 2018-06-16 NOTE — Telephone Encounter (Signed)
Called and spoke with the pt  She is asking about the hospice referral  I advised her that we already have placed order for this and the referral was faxed  She verbalized understanding  Nothing further needed

## 2018-06-16 NOTE — Telephone Encounter (Signed)
Tim Greene,[phone] returning Dr. Shirlee More call from about 20 minutes ago regarding this patient.

## 2018-06-16 NOTE — Telephone Encounter (Signed)
Order sent to [facility] for Hospice eval

## 2018-06-16 NOTE — Addendum Note (Signed)
Addended by: Christen Butter on: 06/16/2018 10:36 AM   Modules accepted: Orders

## 2018-06-16 NOTE — Telephone Encounter (Signed)
Discussed with [name] from   [facility] He has already to do discussed with the patient and we are okay placing an order for hospice evaluation as this will enable her to get services she requires for home health and palliative symptom control. We clarified that she is not full comfort care and we will continue medical therapy  Please place order for hospice evaluation and fax to [phone]  Chilton Greathouse MD [facility] 06/16/2018, 10:30 AM

## 2018-07-07 ENCOUNTER — Encounter: Payer: Self-pay | Admitting: Pulmonary Disease

## 2018-07-07 ENCOUNTER — Ambulatory Visit (INDEPENDENT_AMBULATORY_CARE_PROVIDER_SITE_OTHER): Admitting: Pulmonary Disease

## 2018-07-07 VITALS — BP 114/70 | HR 85 | Ht 64.0 in

## 2018-07-07 DIAGNOSIS — Z5181 Encounter for therapeutic drug level monitoring: Secondary | ICD-10-CM | POA: Diagnosis not present

## 2018-07-07 DIAGNOSIS — J84112 Idiopathic pulmonary fibrosis: Secondary | ICD-10-CM | POA: Diagnosis not present

## 2018-07-07 MED ORDER — OMEPRAZOLE 40 MG PO CPDR: 40.0000 mg | DELAYED_RELEASE_CAPSULE | Freq: Every day | ORAL | 3 refills | Status: DC

## 2018-07-07 MED ORDER — SERTRALINE HCL 50 MG PO TABS: 100.0000 mg | ORAL_TABLET | Freq: Every day | ORAL | 1 refills | Status: DC

## 2018-07-07 MED ORDER — CLONAZEPAM 1 MG PO TABS: 1.0000 mg | ORAL_TABLET | Freq: Every day | ORAL | 1 refills | Status: DC

## 2018-07-07 MED ORDER — ALBUTEROL SULFATE (2.5 MG/3ML) 0.083% IN NEBU: 2.5000 mg | INHALATION_SOLUTION | Freq: Four times a day (QID) | RESPIRATORY_TRACT | 2 refills | Status: DC | PRN

## 2018-07-07 NOTE — Progress Notes (Addendum)
Amber Lloyd    [id number]    1955/10/30  Primary Care Physician:Mann, Yetta GlassmanBenjamin L, PA-C  Referring Physician: Shawnie DapperMann, Benjamin L, PA-C [address] [place] [id number]  Chief complaint: Follow up for IPF, on ofev since June 2019  HPI: 63 year old with history of allergies, anemia, depression, irritable bowel syndrome.  She has complaints of dyspnea on exertion for many months.  This worsened in mid December associated with dizziness and presyncope.  She is evaluated by primary care who gave her prednisone and nebulizer with mild improvement.  Still has ongoing symptoms associated with cough, yellow mucus.  Denies any fevers, chills and she had a chest x-ray in December which shows interstitial opacities and has been referred to pulmonary for further evaluation.  Family history significant for alpha-1 antitrypsin deficiency.  Evaluated in 2010 with MZ phenotype and normal levels.  Labs noted for mild elevation in CCP. She has been [facility] Allena KatzPatel[facility] who feels there is no evidence of rheumatoid arthritis.  [facility].  Patient has decided not to [facility] since she would need significant psychosocial support and she does not want her family to be inconvenienced. She has been referred to pulmonary rehab but could not start due to weakness.  Pets: 3 cats, 4 dogs, turtle, fish, frog.  No birds or farm animals Occupation: Facilities managerCaretaker for patients with dementia Exposures: Has leaky basement, possible mold.  She was employed in a [employer] for 3 years in 1970s and was exposed to fiber at that time. Smoking history: 30-pack-year smoking history.  Quit in 2009 Travel History: Not significant  Interim history: Continues on Ofev Has significant dyspnea, fatigue with good days and bad days   Outpatient Encounter Medications as of 07/07/2018  Medication Sig  . albuterol (PROVENTIL) (2.5 MG/3ML) 0.083% nebulizer  solution Take 3 mLs (2.5 mg total) by nebulization every 6 (six) hours as needed for wheezing or shortness of breath.  . clonazePAM (KLONOPIN) 1 MG tablet Take 1 mg by mouth at bedtime.  . Ibuprofen (ADVIL PO) Take 1 tablet by mouth daily as needed. For shoulder pain and headaches   . IRON PO Take 1 tablet by mouth daily.  . Multiple Vitamin (MULTIVITAMIN) capsule Take 1 capsule by mouth daily.  . Nintedanib (OFEV) 150 MG CAPS Take 150 mg by mouth 2 (two) times daily.  Marland Kitchen. omeprazole (PRILOSEC) 40 MG capsule Take 1 capsule (40 mg total) by mouth daily.  . OXYGEN Inhale into the lungs.  Marland Kitchen. PROVENTIL HFA 108 (90 Base) MCG/ACT inhaler INHALE 2 PUFFS BY MOUTH EVERY 6 HOURS AS NEEDED FOR COUGHING, WHEEZING, OR SHORTNESS OF BREATH  . sertraline (ZOLOFT) 50 MG tablet Take 100 mg by mouth daily.   . Tiotropium Bromide-Olodaterol (STIOLTO RESPIMAT) 2.5-2.5 MCG/ACT AERS Inhale 2 puffs into the lungs daily.   No facility-administered encounter medications on file as of 07/07/2018.    Physical Exam: Blood pressure 114/70, pulse 85, height 5\' 4"  (1.626 m), SpO2 97 %. Gen:      No acute distress HEENT:  EOMI, sclera anicteric Neck:     No masses; no thyromegaly Lungs:   Bilateral crackles at the bases. CV:         Regular rate and rhythm; no murmurs Abd:      + bowel sounds; soft, non-tender; no palpable masses, no distension Ext:    No edema; adequate peripheral perfusion Skin:  Warm and dry; no rash Neuro: alert and oriented x 3 Psych: normal mood and affect  Data Reviewed: Imaging Chest x-ray 03/01/08-  Clear lungs, no acute cardiopulmonary disease. Chest x-ray 05/13/17-bilateral interstitial opacities, more at the bases.  New from 2009. High-resolution CT chest 06/25/17-reticular opacities, bronchiolectasis with basal gradient.  No honeycombing.  Moderate centrilobular and paraseptal emphysema. High-resolution CT 12/24/2017-mild progression of pulmonary fibrosis Reviewed the images  personally  PFTs 06/25/17 FVC 2.37 [33%], FEV1 1.97 [5 9%], F/F 83, TLC 75%, DLCO 20% Mild restriction with severe diffusion defect.  11/21/2017 FVC 2.31 [71%), FEV1 1.88 [476%], DLCO 25% Mild restriction, severe diffusion impairment  6-minute walk 12/24/2017 145 m, post exercise-87% on 2 L  Labs Alpha-1 antitrypsin 02/16/09-[facility], 114 mg/dL 3/84/53- 646 mg/dL  CBC 80/32/12-YQM 6.7, eosinophils 6%, absolute eosinophil count 400 LFTs 11/19/2017-within normal limits.  Connective tissue serologies 07/05/17-ANA, ANCA, Ro, La, scl 70 negative, aldolase 3.6 CCP 20, double-stranded DNA 7, anticardiolipin antibody 49 Rheumatoid factor < 14 Hypersensitivity panel-negative   Assessment:  Limited mobility, weakness, fatigue She has mechanical wheelchair but has struggled with this due to upper and lower extremity weakness about 3/5 Motorized wheelchair will help with home ADLs such as cooking, cleaning and toileting Mechanical wheelchair is not a good option for her as she does not have the strength to use this. Grip is not adequate for motorized scooter. Pt is not able to use a cane or walker due to issues with dexterity She has good mental capacity to operate the power wheelchair safely and is willing to use it at home. We will work with insurance company to obtain a motorized wheelchair.  IPF CT scan reviewed which shows fibrosis in probable UIP pattern.  There is no honeycombing She does not have any significant exposures except for possible mold in basement.  Hypersensitivity panel is negative Serologies are positive for borderline elevation CCP, double-stranded DNA and anticardiolipin antibody.  No evidence of connective tissue disease per rheumatology consult. ,For now we will treat this as IPF and use anti-fibrotics.    Started on Ofev in early June 18.  Decided against esbriet as she has history of piebaldism and tends to sunburn easily. Check LFTs today. Defer pulmonary rehab as she  is too weak to complete therapy and pt declined transplant  Emphysema Family history of alpha-1 antitrypsin deficiency MZ phenotype  She has moderate emphysema on CT scan with no obstruction on PFTs. A1AT levels are normal.  There is no indication at present for replacement therapy. Continue Dulera inhaler, albuterol as needed Supplemental oxygen   GERD, diarrhea She has significant issues with GERD, acid reflux. Reports food gets stuck at times while swallowing.  Continue on omeprazole 40 mg once daily.  Goals of care Discussed goals of care with Amber Lloyd and her son.  They would want full life support on a temporary basis.   She is taking care of her grandchildren with autism at home as her daughter is dealing with substance abuse.  She does not want to consider limitations of care until she can ensure they are taken care of and does not want to go ahead with palliative care. Continue discussions going forward.  Health maintenance 02/13/2018-influenza 11/18/2017-Pneumovax  Plan/Recommendations: - Continue ofev - LFTs today - Continue stiolto inhaler, continue albuterol, supplemental O2 - Omeprazole, loperamide, lactose-free diet. - Arrange for motorized wheelchair.  Chilton Greathouse MD  Pulmonary and Critical Care 07/07/2018, 4:13 PM  CC: Shawnie Dapper, PA-C

## 2018-07-07 NOTE — Patient Instructions (Signed)
We will get comprehensive metabolic panel today for evaluation Continue the ofev  I will fill out the form for the motorized wheelchair We will schedule you for spirometry and diffusion capacity to be done at [facility] Follow-up 3 months.

## 2018-07-08 LAB — COMPREHENSIVE METABOLIC PANEL
ALT: 16 U/L (ref 0–35)
AST: 17 U/L (ref 0–37)
Albumin: 3.7 g/dL (ref 3.5–5.2)
Alkaline Phosphatase: 59 U/L (ref 39–117)
BUN: 10 mg/dL (ref 6–23)
CALCIUM: 9 mg/dL (ref 8.4–10.5)
CO2: 30 mEq/L (ref 19–32)
CREATININE: 0.6 mg/dL (ref 0.40–1.20)
Chloride: 103 mEq/L (ref 96–112)
GFR: 100.62 mL/min (ref >60.00)
Glucose, Bld: 91 mg/dL (ref 70–99)
Potassium: 4.1 mEq/L (ref 3.5–5.1)
Sodium: 139 mEq/L (ref 135–145)
Total Bilirubin: 0.5 mg/dL (ref 0.2–1.2)
Total Protein: 6.4 g/dL (ref 6.0–8.3)

## 2018-07-23 ENCOUNTER — Ambulatory Visit (HOSPITAL_COMMUNITY)
Admission: RE | Admit: 2018-07-23 | Discharge: 2018-07-23 | Disposition: A | Discharge status: Home / Self Care | Source: Ambulatory Visit | Attending: Pulmonary Disease | Admitting: Pulmonary Disease

## 2018-07-23 DIAGNOSIS — J84112 Idiopathic pulmonary fibrosis: Secondary | ICD-10-CM | POA: Diagnosis present

## 2018-07-23 LAB — PULMONARY FUNCTION TEST
DL/VA % pred: 43 %
DL/VA: 1.8 ml/min/mmHg/L
DLCO unc % pred: 28 %
DLCO unc: 5.75 ml/min/mmHg
FEF 25-75 PRE: 1.52 L/s
FEF2575-%Pred-Pre: 70 %
FEV1-%Pred-Pre: 67 %
FEV1-Pre: 1.64 L
FEV1FVC-%Pred-Pre: 105 %
FEV6-%Pred-Pre: 65 %
FEV6-Pre: 2.02 L
FEV6FVC-%Pred-Pre: 104 %
FVC-%Pred-Pre: 63 %
FVC-PRE: 2.02 L
Pre FEV1/FVC ratio: 81 %
Pre FEV6/FVC Ratio: 100 %

## 2018-07-25 ENCOUNTER — Telehealth: Payer: Self-pay | Admitting: Pulmonary Disease

## 2018-07-25 NOTE — Telephone Encounter (Signed)
Spoke with pt. She is requesting her PFT results from 07/23/2018.  Dr. Isaiah Serge - please advise. Thanks.

## 2018-07-29 NOTE — Telephone Encounter (Signed)
Called and spoke with Patient.  Tammy P,NP recommendations given.  Understanding stated.  Nothing further at this time.

## 2018-07-29 NOTE — Telephone Encounter (Signed)
Dr. Isaiah Serge will give final report  They are essentially stable since 10/2017  DLCO -diffusing capacity is slightly improved at 28% (previous 25%)   Restriction in lungs is slightly decreased from last time  67% (previously7 76%. )   Overall stable  Cont w/ ov recs from Dr. Isaiah Serge   He will review final results and discuss with pt .

## 2018-07-29 NOTE — Telephone Encounter (Signed)
Pt is calling back CB# [phone]//[phone]

## 2018-07-29 NOTE — Telephone Encounter (Signed)
PFT on 07/23/18. Patient next appointment with Dr. Isaiah Serge scheduled for 10/07/18.  Message routed to Rubye Oaks, NP to view due to Dr. Isaiah Serge being out of office.   Tammy can you take a look at this please? I seems like a message was sent to Dr. Isaiah Serge back on 07/25/18, but no response received as of today. If you can help, it is appreciated. Thank you much.

## 2018-08-04 ENCOUNTER — Ambulatory Visit: Admitting: Pulmonary Disease

## 2018-08-05 ENCOUNTER — Telehealth: Payer: Self-pay | Admitting: Pulmonary Disease

## 2018-08-05 NOTE — Telephone Encounter (Signed)
Called and spoke with patient, she wanted to know the results from her PFT. Results given from TP below.  Parrett, Virgel Bouquet, NP       3:18 PM  Note    Dr. Isaiah Serge will give final report  They are essentially stable since 10/2017  DLCO -diffusing capacity is slightly improved at 28% (previous 25%)   Restriction in lungs is slightly decreased from last time  67% (previously7 76%. )   Overall stable  Cont w/ ov recs from Dr. Isaiah Serge   He will review final results and discuss with pt .      ______________________  Patient is aware and verbalized understanding. Patient wants to know why she still has not been called about her wheelchair. This referral was sent back in December and received by [facility]. I also see where a form was filled out by Dr. Isaiah Serge on the 07/07/18 appointment and stated that this would be arranged. I do not see any forms on this. Margie do you have any update on this? Please advise, thank you.

## 2018-08-06 NOTE — Telephone Encounter (Signed)
Henderson Newcomer sent to Lorane Gell.  I reached out to our power chair rep, Zenia Resides, and asked her to check on this and give you a call directly as well as the patient.    Please let me know if you don't hear from her soon.   Thank you!  Melissa

## 2018-08-06 NOTE — Telephone Encounter (Signed)
I do not have any information on this.  PCC's can you guys help with this?

## 2018-08-06 NOTE — Telephone Encounter (Signed)
Called spoke with patient to inform her that Efraim Kaufmann has reached out to Zenia Resides the power chair rep Asked patient to please call the office if she does not hear from someone by Friday 3.13.2020 Patient voiced her understanding, will sign off

## 2018-08-06 NOTE — Telephone Encounter (Signed)
I have sent a message to [facility] w/ [employer] to get a status update.

## 2018-08-07 NOTE — Telephone Encounter (Signed)
Dr. Isaiah Serge please advise if we can addend her OV note. Please advise thank you.

## 2018-08-07 NOTE — Telephone Encounter (Addendum)
Spoke to [employer] w/ [employer] who let me know she will reach out to the patient & give her an update.  She states that it looks like the only thing missing for the order is something on the 07/07/2018 OV note that rules out why the pt is not able to use a cane or walker.  Eunice Blase said that she believes this is the only hold up & that everything else looks good.  Asked if PM can add this to the OV note for 07/07/2018, please.  Fax new OV note to [phone],Attn:  Debbie.

## 2018-08-08 NOTE — Telephone Encounter (Signed)
Ok. The OV has been addended.

## 2018-08-08 NOTE — Telephone Encounter (Signed)
I sent Amber Lloyd a community message advising her that the notes were addended.

## 2018-08-25 ENCOUNTER — Other Ambulatory Visit: Payer: Self-pay | Admitting: Pulmonary Disease

## 2018-09-04 ENCOUNTER — Other Ambulatory Visit: Payer: Self-pay | Admitting: Pulmonary Disease

## 2018-09-04 ENCOUNTER — Telehealth: Payer: Self-pay | Admitting: Pulmonary Disease

## 2018-09-04 MED ORDER — CLONAZEPAM 1 MG PO TABS: 1.0000 mg | ORAL_TABLET | Freq: Every day | ORAL | 0 refills | Status: DC

## 2018-09-04 NOTE — Telephone Encounter (Signed)
Called and spoke with pt, that gave me verbal permission to speak with her daughter-Amber Lloyd Advised that Amber Lloyd is not on pt's DPR; once our office is open in July 2020 due to covid19 Pt will need to place [place] on the DPR for the future conversations; pt agreed and understood Advised Amber Lloyd and pt of B.Mack recommendations below Pt's daughter, Amber Lloyd advised that pt is now established with PCP as of today Will have the PCP Amber Herald MD mgt her Clonazepam from next month forward Pt and Amber Lloyd both expressed and verbalized understanding Nothing further needed.

## 2018-09-04 NOTE — Telephone Encounter (Signed)
Patient requesting refill on clonazepam. Patient was last seen by Dr. Isaiah Serge on 07/07/18 at this time Dr. Isaiah Serge refilled medication for #30 with 1 refill. Patient schedule for recall visit in July with Dr. Laurann Montana and spoke with daughter, requesting refill please send to [facility] please.   Arlys John please advise.

## 2018-09-04 NOTE — Telephone Encounter (Signed)
09/04/2018 1518  Before prescribing the patient with Clonazepam, I have checked [place] PMP aware and the patients overdose risk score is 300. Patient has 3 providers prescribing controlled substances. Patient has used 2 pharmacies.   Please counsel the patient on the sedative effects of Clonazepam. Patient to use this medication sparingly and not when driving, drinking alcohol, or using additional sedative medications. Patient has been prescribed 30 tablets with no refills.   I have sent this medication over.   Patient appears to be requiring this medication chronically and every night based on refills. Please also refer patient to psychiatry or back to primary care for chronic management of anxiety. Pulmonology should not manage this chronically.   Elisha Headland FNP

## 2018-09-24 ENCOUNTER — Other Ambulatory Visit: Payer: Self-pay | Admitting: Pulmonary Disease

## 2018-10-06 ENCOUNTER — Other Ambulatory Visit: Payer: Self-pay | Admitting: Pulmonary Disease

## 2018-10-06 ENCOUNTER — Ambulatory Visit: Admitting: Pulmonary Disease

## 2018-10-07 ENCOUNTER — Ambulatory Visit: Admitting: Pulmonary Disease

## 2018-10-10 ENCOUNTER — Other Ambulatory Visit: Payer: Self-pay | Admitting: Pulmonary Disease

## 2018-10-10 NOTE — Telephone Encounter (Signed)
Please route to Dr. Isaiah Serge who is in office today.   Amber Lloyd

## 2018-10-10 NOTE — Telephone Encounter (Signed)
Is this appropriate for refill ? 

## 2018-10-10 NOTE — Telephone Encounter (Signed)
Is this refill appropriate?  

## 2018-11-19 ENCOUNTER — Telehealth: Payer: Self-pay | Admitting: Pulmonary Disease

## 2018-11-19 NOTE — Telephone Encounter (Signed)
Called and spoke with Patient.  Patient stated she receives Ofev from manufacturer, [employer].  Patient stated she called for refill and [employer] [facility] stated she had to complete a new application.  Patient stated they are mailing it to her. Patient stated she would need a new prescription faxed to [facility] cares. Explained to Patient, once she receives paperwork and completes it, she can drop it by [facility] and we can send a new prescription, with paperwork.  Understanding stated.  Nothing further at this time.

## 2018-11-26 ENCOUNTER — Other Ambulatory Visit: Payer: Self-pay | Admitting: Pulmonary Disease

## 2018-12-01 NOTE — Telephone Encounter (Signed)
[employer] [facility] Ofev patient assistance received today.  Paperwork filled out.  Dr Vaughan Browner will return to office tomorrow, 7/7.  Will have him sign paperwork and fax to [facility] cares.  Will route message to myself to follow up

## 2018-12-03 NOTE — Telephone Encounter (Signed)
Patient assistance signed by Dr Vaughan Browner and faxed to [facility] cares.  Nothing further at this time.

## 2018-12-08 NOTE — Telephone Encounter (Signed)
Fax received this morning from [facility] cares.  Patient approved in [employer] [facility] patient assistance 12/04/2018-09/05/2019 for Ofev.

## 2019-03-18 ENCOUNTER — Telehealth: Payer: Self-pay | Admitting: Pulmonary Disease

## 2019-03-18 NOTE — Telephone Encounter (Signed)
Call returned to patient son Herbie Baltimore), he states his mother tested positive for Covid. He states they just went to a local spot to be tested.He reports several family members tested positive including all the grandchildren. She was not initially having symptoms however she has began to have body aches, feels like her lugs are hurting, denies fever, reports a lost of taste and smell. Denies diarrhea. Reports a constant headache. I confirmed that she is using her inhalers. I inquired as to whether she has been using her nebulizer. She states yes twice daily. I made her aware that she should not use her nebulizer in the home if other people are present who are negative. The son states he tested negative but he is starting to develop symptoms as well and will go back to be re-tested. Virtual visit appt made. Re-iterated that should not use that nebulizer around anyone as much as possible until she speaks with the provider. No distress noted. Patient able to speak complete sentences, aware to call 911 if symptoms worsen prior to her virtual visit in the Am. Nothing further needed at this time.

## 2019-03-19 ENCOUNTER — Emergency Department (HOSPITAL_COMMUNITY)

## 2019-03-19 ENCOUNTER — Inpatient Hospital Stay (HOSPITAL_COMMUNITY)
Admission: EM | Admit: 2019-03-19 | Discharge: 2019-03-24 | DRG: 177 | Disposition: A | Discharge status: Home Health | Attending: Internal Medicine | Admitting: Internal Medicine

## 2019-03-19 ENCOUNTER — Encounter: Payer: Self-pay | Admitting: Pulmonary Disease

## 2019-03-19 ENCOUNTER — Encounter (HOSPITAL_COMMUNITY): Payer: Self-pay

## 2019-03-19 ENCOUNTER — Telehealth (INDEPENDENT_AMBULATORY_CARE_PROVIDER_SITE_OTHER): Admitting: Pulmonary Disease

## 2019-03-19 DIAGNOSIS — U071 COVID-19: Secondary | ICD-10-CM | POA: Diagnosis present

## 2019-03-19 DIAGNOSIS — D72810 Lymphocytopenia: Secondary | ICD-10-CM | POA: Diagnosis not present

## 2019-03-19 DIAGNOSIS — F419 Anxiety disorder, unspecified: Secondary | ICD-10-CM | POA: Diagnosis present

## 2019-03-19 DIAGNOSIS — J84112 Idiopathic pulmonary fibrosis: Secondary | ICD-10-CM | POA: Diagnosis present

## 2019-03-19 DIAGNOSIS — J9611 Chronic respiratory failure with hypoxia: Secondary | ICD-10-CM | POA: Diagnosis present

## 2019-03-19 DIAGNOSIS — J8410 Pulmonary fibrosis, unspecified: Secondary | ICD-10-CM

## 2019-03-19 DIAGNOSIS — K589 Irritable bowel syndrome without diarrhea: Secondary | ICD-10-CM | POA: Diagnosis present

## 2019-03-19 DIAGNOSIS — F329 Major depressive disorder, single episode, unspecified: Secondary | ICD-10-CM | POA: Diagnosis present

## 2019-03-19 DIAGNOSIS — J849 Interstitial pulmonary disease, unspecified: Secondary | ICD-10-CM | POA: Diagnosis present

## 2019-03-19 DIAGNOSIS — D509 Iron deficiency anemia, unspecified: Secondary | ICD-10-CM | POA: Diagnosis not present

## 2019-03-19 DIAGNOSIS — E8801 Alpha-1-antitrypsin deficiency: Secondary | ICD-10-CM | POA: Diagnosis present

## 2019-03-19 DIAGNOSIS — J1289 Other viral pneumonia: Secondary | ICD-10-CM | POA: Diagnosis present

## 2019-03-19 DIAGNOSIS — I951 Orthostatic hypotension: Secondary | ICD-10-CM | POA: Diagnosis not present

## 2019-03-19 DIAGNOSIS — Z87891 Personal history of nicotine dependence: Secondary | ICD-10-CM

## 2019-03-19 DIAGNOSIS — Z9981 Dependence on supplemental oxygen: Secondary | ICD-10-CM | POA: Diagnosis not present

## 2019-03-19 DIAGNOSIS — K219 Gastro-esophageal reflux disease without esophagitis: Secondary | ICD-10-CM | POA: Diagnosis present

## 2019-03-19 DIAGNOSIS — Z820 Family history of epilepsy and other diseases of the nervous system: Secondary | ICD-10-CM

## 2019-03-19 DIAGNOSIS — D649 Anemia, unspecified: Secondary | ICD-10-CM | POA: Diagnosis present

## 2019-03-19 DIAGNOSIS — Z148 Genetic carrier of other disease: Secondary | ICD-10-CM

## 2019-03-19 DIAGNOSIS — J439 Emphysema, unspecified: Secondary | ICD-10-CM | POA: Diagnosis present

## 2019-03-19 DIAGNOSIS — D72819 Decreased white blood cell count, unspecified: Secondary | ICD-10-CM | POA: Diagnosis present

## 2019-03-19 DIAGNOSIS — D6959 Other secondary thrombocytopenia: Secondary | ICD-10-CM | POA: Diagnosis present

## 2019-03-19 DIAGNOSIS — J1282 Pneumonia due to coronavirus disease 2019: Secondary | ICD-10-CM | POA: Diagnosis present

## 2019-03-19 HISTORY — DX: Pulmonary fibrosis, unspecified: J84.10

## 2019-03-19 LAB — CBC WITH DIFFERENTIAL/PLATELET
Abs Immature Granulocytes: 0.01 10*3/uL (ref 0.00–0.07)
Basophils Absolute: 0 10*3/uL (ref 0.0–0.1)
Basophils Relative: 0 %
Eosinophils Absolute: 0 10*3/uL (ref 0.0–0.5)
Eosinophils Relative: 1 %
HCT: 40.8 % (ref 36.0–46.0)
Hemoglobin: 13.1 g/dL (ref 12.0–15.0)
Immature Granulocytes: 0 %
Lymphocytes Relative: 17 %
Lymphs Abs: 0.5 10*3/uL — ABNORMAL LOW (ref 0.7–4.0)
MCH: 32.3 pg (ref 26.0–34.0)
MCHC: 32.1 g/dL (ref 30.0–36.0)
MCV: 100.5 fL — ABNORMAL HIGH (ref 80.0–100.0)
Monocytes Absolute: 0.2 10*3/uL (ref 0.1–1.0)
Monocytes Relative: 6 %
Neutro Abs: 2 10*3/uL (ref 1.7–7.7)
Neutrophils Relative %: 76 %
Platelets: 106 10*3/uL — ABNORMAL LOW (ref 150–400)
RBC: 4.06 MIL/uL (ref 3.87–5.11)
RDW: 12.5 % (ref 11.5–15.5)
WBC: 2.7 10*3/uL — ABNORMAL LOW (ref 4.0–10.5)
nRBC: 0 % (ref 0.0–0.2)

## 2019-03-19 LAB — LACTATE DEHYDROGENASE: LDH: 151 U/L (ref 98–192)

## 2019-03-19 LAB — COMPREHENSIVE METABOLIC PANEL
ALT: 20 U/L (ref 0–44)
AST: 24 U/L (ref 15–41)
Albumin: 3.1 g/dL — ABNORMAL LOW (ref 3.5–5.0)
Alkaline Phosphatase: 58 U/L (ref 38–126)
Anion gap: 5 (ref 5–15)
BUN: 10 mg/dL (ref 8–23)
CO2: 28 mmol/L (ref 22–32)
Calcium: 7.9 mg/dL — ABNORMAL LOW (ref 8.9–10.3)
Chloride: 104 mmol/L (ref 98–111)
Creatinine, Ser: 0.63 mg/dL (ref 0.44–1.00)
GFR calc Af Amer: >60 mL/min (ref >60)
GFR calc non Af Amer: >60 mL/min (ref >60)
Glucose, Bld: 98 mg/dL (ref 70–99)
Potassium: 3.3 mmol/L — ABNORMAL LOW (ref 3.5–5.1)
Sodium: 137 mmol/L (ref 135–145)
Total Bilirubin: 0.6 mg/dL (ref 0.3–1.2)
Total Protein: 5.8 g/dL — ABNORMAL LOW (ref 6.5–8.1)

## 2019-03-19 LAB — PROCALCITONIN: Procalcitonin: <0.1 ng/mL

## 2019-03-19 LAB — D-DIMER, QUANTITATIVE: D-Dimer, Quant: <0.27 ug/mL-FEU (ref 0.00–0.50)

## 2019-03-19 LAB — TROPONIN I (HIGH SENSITIVITY): Troponin I (High Sensitivity): <2 ng/L (ref <18)

## 2019-03-19 LAB — FIBRINOGEN: Fibrinogen: 337 mg/dL (ref 210–475)

## 2019-03-19 LAB — C-REACTIVE PROTEIN: CRP: 0.9 mg/dL (ref <1.0)

## 2019-03-19 LAB — SARS CORONAVIRUS 2 BY RT PCR (HOSPITAL ORDER, PERFORMED IN ~~LOC~~ HOSPITAL LAB): SARS Coronavirus 2: POSITIVE — AB

## 2019-03-19 LAB — FERRITIN: Ferritin: 196 ng/mL (ref 11–307)

## 2019-03-19 MED ORDER — SERTRALINE HCL 50 MG PO TABS
100.0000 mg | ORAL_TABLET | Freq: Every day | ORAL | Status: DC
  Administered 2019-03-20 – 2019-03-24 (×5): 100 mg via ORAL
  Filled 2019-03-19 (×5): qty 2

## 2019-03-19 MED ORDER — POLYETHYLENE GLYCOL 3350 17 G PO PACK: 17.0000 g | PACK | Freq: Every day | ORAL | Status: DC | PRN

## 2019-03-19 MED ORDER — ACETAMINOPHEN 500 MG PO TABS
1000.0000 mg | ORAL_TABLET | Freq: Once | ORAL | Status: AC
  Administered 2019-03-19: 1000 mg via ORAL
  Filled 2019-03-19: qty 2

## 2019-03-19 MED ORDER — ALBUTEROL SULFATE HFA 108 (90 BASE) MCG/ACT IN AERS
4.0000 | INHALATION_SPRAY | Freq: Once | RESPIRATORY_TRACT | Status: AC
  Administered 2019-03-19: 4 via RESPIRATORY_TRACT
  Filled 2019-03-19: qty 6.7

## 2019-03-19 MED ORDER — ENOXAPARIN SODIUM 40 MG/0.4ML ~~LOC~~ SOLN
40.0000 mg | SUBCUTANEOUS | Status: DC
  Administered 2019-03-20 – 2019-03-24 (×5): 40 mg via SUBCUTANEOUS
  Filled 2019-03-19 (×5): qty 0.4

## 2019-03-19 MED ORDER — DEXAMETHASONE SODIUM PHOSPHATE 10 MG/ML IJ SOLN
6.0000 mg | INTRAMUSCULAR | Status: DC
  Administered 2019-03-19 – 2019-03-23 (×5): 6 mg via INTRAVENOUS
  Filled 2019-03-19 (×5): qty 1

## 2019-03-19 MED ORDER — SODIUM CHLORIDE 0.9 % IV BOLUS (SEPSIS)
500.0000 mL | Freq: Once | INTRAVENOUS | Status: AC
  Administered 2019-03-19: 500 mL via INTRAVENOUS

## 2019-03-19 MED ORDER — DEXAMETHASONE 6 MG PO TABS: 6.0000 mg | ORAL_TABLET | Freq: Every day | ORAL | 0 refills | Status: DC

## 2019-03-19 MED ORDER — NINTEDANIB ESYLATE 150 MG PO CAPS
150.0000 mg | ORAL_CAPSULE | Freq: Two times a day (BID) | ORAL | Status: DC
  Administered 2019-03-20 – 2019-03-23 (×6): 150 mg via ORAL
  Filled 2019-03-19 (×7): qty 1

## 2019-03-19 MED ORDER — ONDANSETRON HCL 4 MG/2ML IJ SOLN: 4.0000 mg | Freq: Four times a day (QID) | INTRAMUSCULAR | Status: DC | PRN

## 2019-03-19 MED ORDER — ONDANSETRON HCL 4 MG PO TABS: 4.0000 mg | ORAL_TABLET | Freq: Four times a day (QID) | ORAL | Status: DC | PRN

## 2019-03-19 MED ORDER — PANTOPRAZOLE SODIUM 40 MG PO TBEC
40.0000 mg | DELAYED_RELEASE_TABLET | Freq: Every day | ORAL | Status: DC
  Administered 2019-03-20 – 2019-03-24 (×5): 40 mg via ORAL
  Filled 2019-03-19 (×5): qty 1

## 2019-03-19 MED ORDER — ACETAMINOPHEN 325 MG PO TABS: 650.0000 mg | ORAL_TABLET | Freq: Four times a day (QID) | ORAL | Status: DC | PRN

## 2019-03-19 NOTE — ED Triage Notes (Signed)
Pt is COVID positive and is having increasing SOB. Wears 4L [place] all the time. Experiencing body aches, fevers, chills, and SOB

## 2019-03-19 NOTE — Patient Instructions (Signed)
I am sorry you are not feeling well We will call in Dexamethasone 6 mg PO a day for 10 days We will also call in an incentive spirometer.  Use this as much as possible, preferably every hour. It will help if you lay on your tummy while lying in bed Titrate oxygen to maintain O2 sats at 90% or greater  If you are feeling worse then head to [facility] emergency room and we can admit you to [place] from there for further treatment We will have a follow-up televisit in 1 week.

## 2019-03-19 NOTE — Progress Notes (Addendum)
Virtual Visit via video Note  I connected with Gerald Leitz on 03/19/19 at  8:45 AM EDT by telephone and verified that I am speaking with the correct person using two identifiers.  Location: Patient: Home Provider: [facility], [place], [place]   I discussed the limitations, risks, security and privacy concerns of performing an evaluation and management service by video and the availability of in person appointments. I also discussed with the patient that there may be a patient responsible charge related to this service. The patient expressed understanding and agreed to proceed.   History of Present Illness: Video visit to evaluate COVID-19 infection in the setting of IPF  63 year old with idiopathic pulmonary fibrosis on Ofev Diagnosed with COVID-19 on 10/21 at primary care She visited her primary care on the 10/14 for increasing dyspnea and given a course of steroids and doxycycline with temporary improvement.  She finished her prednisone on the 20th and continues on doxycycline She has started feeling worse again over the past few days with increasing dyspnea, fatigue.  Her foster children will recently diagnosed with Covid 19 on the 16th and 19th.  She subesquently got tested on the 21st with positive result.  Complains of dyspnea on exertion, fatigue.  Currently on 4 L/min flow oxygen which is her baseline with sats in the mid 90s.  Observations/Objective: Elderly female lying in bed in mild distress Appears pallid, frail and weak  Assessment and Plan: IPF, COVID-19 infection Mrs. Bogdanski has progressive IPF on Ofev therapy She has previously declined lung transplant evaluation.    Now with COVID-19 infection.  I am concerned that she can get significantly get worse. I gave her the option of getting admitted to [facility] where we can treat her with remdesivir and possibly convalescent plasma but she is aware that her family cannot visit and is unsure if she  wants to come.  For now she will remain at home on supplemental oxygen Call in incentive spirometer for her to use every day Encouraged her to lay on her tummy as much as possible and titrate oxygen to maintain sats at 90% or greater Start dexamethasone 6 mg p.o. daily for 10 days. Continue Ofev as anti fibrotic therapy may be beneficial with Covid 19 infection.  Goals of care  Spoke with pt and her son over video regarding goals of care.  We had discussions in the past and she is DNR.  We had discussed hospice/palliative care in the past but she was not ready for hospice.  It would be reasonable to give her a trial of therapy if she decides to get admitted but if there is worsening then she and family would be open to discussion on comfort measures  Follow Up Instructions: - Continue supplemental oxygen - Incentive spirometer.  Encourage tummy time at home - Dexamethasone 6 mg daily for 10 days - Continue Ofev - Asked her to go to the emergency room at [facility] if there is any worsening.   I discussed the assessment and treatment plan with the patient. The patient was provided an opportunity to ask questions and all were answered. The patient agreed with the plan and demonstrated an understanding of the instructions.   The patient was advised to call back or seek an in-person evaluation if the symptoms worsen or if the condition fails to improve as anticipated.  I provided 25 minutes of non-face-to-face time during this encounter.  Marshell Garfinkel MD [facility] 03/19/2019,  8:51 AM

## 2019-03-19 NOTE — Progress Notes (Signed)
Pharmacy Antibiotic Note  Amber Lloyd is a 63 y.o. female admitted on 03/19/2019 with COVID 19 diagnosed as outpt 10/19.  Pharmacy has been consulted for Remdesivir dosing.  CXR shows vague bibasilar opacities may be related to interstitial lung disease or known COVID-19 pneumonia. Pt currently just on 4L O2 (same as at home) with sats upper 90s ALT 20  Plan: Remdesivir 200mg  IV now then 100mg  IV daily x 4 days Will f/u ALT and pt's clinical condition  Height: 5\' 4"  (162.6 cm) Weight: 170 lb (77.1 kg) IBW/kg (Calculated) : 54.7  Temp (24hrs), Avg:98.6 F (37 C), Min:98.5 F (36.9 C), Max:98.6 F (37 C)  Recent Labs  Lab 03/19/19 1652  WBC 2.7*  CREATININE 0.63    Estimated Creatinine Clearance: 71.4 mL/min (by C-G formula based on SCr of 0.63 mg/dL).    No Known Allergies  Antimicrobials this admission: 10/23 Remdesivir >>10/27   Thank you for allowing pharmacy to be a part of this patient's care.  Sherlon Handing, PharmD, BCPS CGV Clinical pharmacist phone [phone] 03/19/2019 9:55 PM

## 2019-03-19 NOTE — H&P (Signed)
History and Physical    AIRYN ELLZEY ZHG:[id number] DOB: 05-05-1956 DOA: 03/19/2019  PCP: Cory Munch, PA-C   Patient coming from: Home  I have personally briefly reviewed patient's old medical records in [place]  Chief Complaint: Difficulty breathing, Covid positive  HPI: Amber Lloyd is a 63 y.o. female with medical history significant for pulmonary emphysema chronic respiratory failure, alpha-1 antitrypsin, idiopathic pulmonary fibrosis, anxiety and depression. Patient reports onset of difficulty breathing 10/14, she was given a course of steroids and doxycycline with temporary improvement.  And then symptoms worsened.  She also had some diarrhea.  Patient's foster children were diagnosed with Covid 19 on the 16th and 19th.  She got tested on 25th and was positive.  With increasing difficulty breathing, ongoing chills fatigue body aches, patient presented to the ED.  Patient talked to her pulmonologist Dr. Vaughan Browner today, he recommended admission to [facility], but at the time patient was hesitant.   ED Course: O2 sats greater than 99% on home 4 L.  Blood pressure systolic 196-222, temperature 98.6.  WBC 2.7.  Potassium 3.3.  Portable abdominal x-ray, for reports of diarrhea, negative for acute abnormality.  Portable chest x-ray still pending.  Hospitalist to admit for Covid infection.  Review of Systems: As per HPI all other systems reviewed and negative.  Past Medical History:  Diagnosis Date  . Anemia   . Anxiety   . Depression   . GERD (gastroesophageal reflux disease)   . IBS (irritable bowel syndrome)   . ILD (interstitial lung disease) ([place])   . Pulmonary fibrosis ([place])     Past Surgical History:  Procedure Laterality Date  . ABDOMINAL HYSTERECTOMY    . PARTIAL HYSTERECTOMY Right 1983  . TUBAL LIGATION  1981     reports that she quit smoking about 11 years ago. Her smoking use included cigarettes. She has a 34.00 pack-year smoking history. She has never  used smokeless tobacco. She reports that she does not drink alcohol or use drugs.  No Known Allergies  Family History  Problem Relation Age of Onset  . Alzheimer's disease Mother   . Anemia Mother   . Anemia Sister   . Anemia Daughter   . Anemia Sister     Prior to Admission medications   Medication Sig Start Date End Date Taking? Authorizing Provider  albuterol (PROVENTIL) (2.5 MG/3ML) 0.083% nebulizer solution INHALE THE CONTENTS OF ONE VIAL VIA NEBULIZER EVERY 6 HOURS AS NEEDED FOR WHEEZING OR SHORTNESS OF BREATH Patient taking differently: Take 2.5 mg by nebulization every 6 (six) hours as needed for wheezing or shortness of breath.  09/04/18  Yes Mannam, Praveen, MD  clonazePAM (KLONOPIN) 1 MG tablet TAKE 1 TABLET BY MOUTH AT BEDTIME Patient taking differently: Take 1 mg by mouth at bedtime.  10/11/18   Mannam, Hart Robinsons, MD  dexamethasone (DECADRON) 6 MG tablet Take 1 tablet (6 mg total) by mouth daily. 03/19/19   Mannam, Hart Robinsons, MD  doxycycline (DORYX) 100 MG EC tablet Take 100 mg by mouth 2 (two) times daily.    [provider]  doxycycline (VIBRAMYCIN) 100 MG capsule Take 100 mg by mouth 2 (two) times daily. 10 day course starting on 03/11/2019 03/11/19   [provider]  ibuprofen (ADVIL) 200 MG tablet Take 200 mg by mouth daily as needed. For shoulder pain and headaches     [provider]  IRON PO Take 1 tablet by mouth daily.    [provider]  methylPREDNISolone (  MEDROL DOSEPAK) 4 MG TBPK tablet Take 4-24 mg by mouth See admin instructions. Take as directed on package [id number](6,5,4,3,2,1) starting on 03/11/2019 03/11/19   [provider]  Multiple Vitamin (MULTIVITAMIN) capsule Take 1 capsule by mouth daily.    [provider]  Nintedanib (OFEV) 150 MG CAPS Take 150 mg by mouth 2 (two) times daily.    [provider]  omeprazole (PRILOSEC) 40 MG capsule Take 1 capsule (40 mg total) by mouth daily. 07/07/18   Chilton GreathouseMannam, Praveen,  MD  OXYGEN Inhale into the lungs.    [provider]  PROVENTIL HFA 108 (90 Base) MCG/ACT inhaler INHALE 2 PUFFS BY MOUTH EVERY 6 HOURS AS NEEDED FOR COUGHING, WHEEZING, OR SHORTNESS OF BREATH Patient taking differently: Inhale 1-2 puffs into the lungs every 6 (six) hours as needed for wheezing or shortness of breath.  12/17/17   Jacquelin HawkingMcElroy, Shannon, PA-C  sertraline (ZOLOFT) 50 MG tablet Take 2 tablets (100 mg total) by mouth daily. 07/07/18   Mannam, Colbert CoyerPraveen, MD  Tiotropium Bromide-Olodaterol (STIOLTO RESPIMAT) 2.5-2.5 MCG/ACT AERS Inhale 2 puffs into the lungs daily. 01/08/18   Chilton GreathouseMannam, Praveen, MD    Physical Exam: Vitals:   03/19/19 1536 03/19/19 1630 03/19/19 2000 03/19/19 2100  BP:  102/72 108/80 100/84  Pulse:  68 68 68  Resp:  19 20 (!) 22  Temp:  98.5 F (36.9 C)    TempSrc:  Oral    SpO2:  100% 100% 99%  Weight: 77.1 kg     Height: 5\' 4"  (1.626 m)       Constitutional: NAD, calm, comfortable Vitals:   03/19/19 1536 03/19/19 1630 03/19/19 2000 03/19/19 2100  BP:  102/72 108/80 100/84  Pulse:  68 68 68  Resp:  19 20 (!) 22  Temp:  98.5 F (36.9 C)    TempSrc:  Oral    SpO2:  100% 100% 99%  Weight: 77.1 kg     Height: 5\' 4"  (1.626 m)      Eyes: PERRL, lids and conjunctivae normal ENMT: Mucous membranes are moist. Posterior pharynx clear of any exudate or lesions. Neck: normal, supple, no masses, no thyromegaly Respiratory: clear to auscultation bilaterally, no wheezing, no crackles. Normal respiratory effort. No accessory muscle use.  Cardiovascular: Regular rate and rhythm, no murmurs / rubs / gallops. No extremity edema. 2+ pedal pulses. .  Abdomen: no tenderness, no masses palpated. No hepatosplenomegaly. Bowel sounds positive.  Musculoskeletal: no clubbing / cyanosis. No joint deformity upper and lower extremities. Good ROM, no contractures. Normal muscle tone.  Skin: no rashes, lesions, ulcers. No induration Neurologic: CN 2-12 grossly intact. Sensation  intact, DTR normal. Strength 5/5 in all 4.  Psychiatric: Normal judgment and insight. Alert and oriented x 3. Normal mood.   Labs on Admission: I have personally reviewed following labs and imaging studies  CBC: Recent Labs  Lab 03/19/19 1652  WBC 2.7*  NEUTROABS 2.0  HGB 13.1  HCT 40.8  MCV 100.5*  PLT 106*   Basic Metabolic Panel: Recent Labs  Lab 03/19/19 1652  NA 137  K 3.3*  CL 104  CO2 28  GLUCOSE 98  BUN 10  CREATININE 0.63  CALCIUM 7.9*   Liver Function Tests: Recent Labs  Lab 03/19/19 1652  AST 24  ALT 20  ALKPHOS 58  BILITOT 0.6  PROT 5.8*  ALBUMIN 3.1*   Radiological Exams on Admission: Dg Abd Portable 1 View  Result Date: 03/19/2019 CLINICAL DATA:  63 year old female with history of  shortness of breath. COVID positive. EXAM: PORTABLE ABDOMEN - 1 VIEW COMPARISON:  No priors. FINDINGS: The bowel gas pattern is normal. No radio-opaque calculi or other significant radiographic abnormality are seen. IMPRESSION: Negative. Electronically Signed   By: Trudie Reed M.D.   On: 03/19/2019 17:33    EKG: Independently reviewed.   Assessment/Plan Active Problems:   Pneumonia due to COVID-19 virus   COVID-19 pneumonia-dyspnea, cough.  With chronic respiratory failure chronic lung disease.  WBC 2.7.  High risk for poor outcomes.  Currently on home 4 L O2.  Portable chest x-ray - chronic interstitial lung disease, Vague bibasilar opacities may be related to interstitial lung, disease or known COVID-19 pneumonia. -IV dexamethasone 6 mg IV daily -Obtain inflammatory markers-CRP, ferritin, fibrinogen, D-dimer, LDH, troponin -Procalcitonin -Pharmacy to initiate Remdesmivir, Actemra on arrival to [facility] -EKG -Albuterol inhalers as needed -Respiratory protocol  Pulmonary emphysema, idiopathic pulmonary fibrosis- history of alpha-1 antitrypsin deficiency.  With chronic respiratory failure on 4 L. -Supplemental O2, -Resume home nintedanib, patient reports  compliance  Depression, anxiety -Resume home sertraline  DVT prophylaxis: Lovenox Code Status: Full code Family Communication: None at bedside  Disposition Plan: Per rounding team Consults called: None Admission status: Inpatient, telemetry I certify that at the point of admission it is my clinical judgment that the patient will require inpatient hospital care spanning beyond 2 midnights from the point of admission due to high intensity of service, high risk for further deterioration and high frequency of surveillance required. The following factors support the patient status of inpatient: COVID-19 pneumonia, requiring IV antivirals and close monitoring, high risk for decompensation.   Onnie Boer MD [facility]  03/19/2019, 10:07 PM

## 2019-03-19 NOTE — Addendum Note (Signed)
Addended byMarshell Garfinkel on: 03/19/2019 02:12 PM   Modules accepted: Level of Service

## 2019-03-19 NOTE — ED Notes (Signed)
Date and time results received: 03/19/19 10:55 PM  (use smartphrase ".now" to insert current time)  Test: COVID  Critical Value: positive  Name of Provider Notified: Denton Brick, MD  Orders Received? Or Actions Taken?: na

## 2019-03-19 NOTE — ED Provider Notes (Signed)
[facility] EMERGENCY DEPARTMENT Provider Note   CSN: [id number] Arrival date & time: 03/19/19  1527     History   Chief Complaint Chief Complaint  Patient presents with  . COVID Positive    HPI Amber Lloyd is a 63 y.o. female.     Patient is a 63 year old female coming from home with history of idiopathic pulmonary fibrosis on chronic 4 L of oxygen, anxiety, anemia, depression, GERD presenting to the emergency department for shortness of breath in the context of testing positive for Covid.  Patient has had worsening dyspnea since October 14.  She reports that her family members tested positive for Covid and so she got tested this past Monday and was also positive.  Reports that she is feeling generally fatigued and so tired that she does not even want to lift her head up off the bed.  Reports increased shortness of breath.  Reports increased cough and intermittent fevers.  Denies any nausea, vomiting, chest pain.  She does have some mild diarrhea occasionally.  Patient had a video visit with her pulmonologist Dr. Charleston Poot with [facility] pulmonology today who requested that the patient be admitted to [facility] for further treatment.     Past Medical History:  Diagnosis Date  . Anemia   . Anxiety   . Depression   . GERD (gastroesophageal reflux disease)   . IBS (irritable bowel syndrome)   . ILD (interstitial lung disease) (HCC)   . Pulmonary fibrosis [facility])     Patient Active Problem List   Diagnosis Date Noted  . Acute non-recurrent maxillary sinusitis 04/15/2018  . IPF (idiopathic pulmonary fibrosis) (HCC) 11/20/2017  . Alpha-1-antitrypsin deficiency carrier 09/24/2017  . Interstitial pulmonary disease (HCC) 09/24/2017  . Pulmonary emphysema (HCC) 02/28/2016  . Anemia 01/31/2016  . Anxiety 01/10/2016    Past Surgical History:  Procedure Laterality Date  . ABDOMINAL HYSTERECTOMY    . PARTIAL HYSTERECTOMY Right 1983  . TUBAL LIGATION  1981     OB History    No obstetric history on file.      Home Medications    Prior to Admission medications   Medication Sig Start Date End Date Taking? Authorizing Provider  albuterol (PROVENTIL) (2.5 MG/3ML) 0.083% nebulizer solution INHALE THE CONTENTS OF ONE VIAL VIA NEBULIZER EVERY 6 HOURS AS NEEDED FOR WHEEZING OR SHORTNESS OF BREATH 09/04/18   Mannam, Praveen, MD  clonazePAM (KLONOPIN) 1 MG tablet TAKE 1 TABLET BY MOUTH AT BEDTIME 10/11/18   Mannam, Praveen, MD  dexamethasone (DECADRON) 6 MG tablet Take 1 tablet (6 mg total) by mouth daily. 03/19/19   Mannam, Colbert Coyer, MD  doxycycline (DORYX) 100 MG EC tablet Take 100 mg by mouth 2 (two) times daily.    [provider]  Ibuprofen (ADVIL PO) Take 1 tablet by mouth daily as needed. For shoulder pain and headaches     [provider]  IRON PO Take 1 tablet by mouth daily.    [provider]  Multiple Vitamin (MULTIVITAMIN) capsule Take 1 capsule by mouth daily.    [provider]  Nintedanib (OFEV) 150 MG CAPS Take 150 mg by mouth 2 (two) times daily.    [provider]  omeprazole (PRILOSEC) 40 MG capsule Take 1 capsule (40 mg total) by mouth daily. 07/07/18   Chilton Greathouse, MD  OXYGEN Inhale into the lungs.    [provider]  PROVENTIL HFA 108 (90 Base) MCG/ACT inhaler INHALE 2 PUFFS BY MOUTH EVERY 6 HOURS AS  NEEDED FOR COUGHING, WHEEZING, OR SHORTNESS OF BREATH 12/17/17   Soyla Dryer, PA-C  sertraline (ZOLOFT) 50 MG tablet Take 2 tablets (100 mg total) by mouth daily. 07/07/18   Mannam, Hart Robinsons, MD  Tiotropium Bromide-Olodaterol (STIOLTO RESPIMAT) 2.5-2.5 MCG/ACT AERS Inhale 2 puffs into the lungs daily. 01/08/18   Marshell Garfinkel, MD    Family History Family History  Problem Relation Age of Onset  . Alzheimer's disease Mother   . Anemia Mother   . Anemia Sister   . Anemia Daughter   . Anemia Sister     Social History Social History   Tobacco Use  . Smoking status: Former Smoker     Packs/day: 1.00    Years: 34.00    Pack years: 34.00    Types: Cigarettes    Quit date: 07/27/2007    Years since quitting: 11.6  . Smokeless tobacco: Never Used  Substance Use Topics  . Alcohol use: No  . Drug use: No     Allergies   Patient has no known allergies.   Review of Systems Review of Systems  Constitutional: Positive for appetite change, diaphoresis, fatigue and fever. Negative for chills.  HENT: Negative for congestion, sinus pain and sore throat.   Respiratory: Positive for cough and shortness of breath. Negative for chest tightness.   Cardiovascular: Negative for chest pain, palpitations and leg swelling.  Gastrointestinal: Positive for diarrhea. Negative for abdominal pain, constipation, nausea and vomiting.  Genitourinary: Negative for dysuria.  Musculoskeletal: Negative for back pain and myalgias.  Skin: Negative for rash.  Allergic/Immunologic: Positive for immunocompromised state.  Neurological: Positive for weakness (Generalized). Negative for dizziness, light-headedness and headaches.     Physical Exam Updated Vital Signs Temp 98.6 F (37 C) (Oral)   Ht 5\' 4"  (1.626 m)   Wt 77.1 kg   BMI 29.18 kg/m   Physical Exam Vitals signs and nursing note reviewed.  Constitutional:      General: She is not in acute distress.    Appearance: She is not toxic-appearing or diaphoretic.     Comments: Chronically ill-appearing  HENT:     Head: Normocephalic.     Mouth/Throat:     Mouth: Mucous membranes are moist.  Eyes:     Pupils: Pupils are equal, round, and reactive to light.  Cardiovascular:     Rate and Rhythm: Normal rate.     Pulses: Normal pulses.  Pulmonary:     Effort: Tachypnea present.     Breath sounds: Examination of the right-middle field reveals rhonchi. Examination of the right-lower field reveals rhonchi and rales. Decreased breath sounds, rhonchi and rales present.  Musculoskeletal:     Right lower leg: No edema.     Left lower leg:  No edema.  Skin:    Capillary Refill: Capillary refill takes less than 2 seconds.     Coloration: Skin is pale.  Neurological:     General: No focal deficit present.     Mental Status: She is alert.      ED Treatments / Results  Labs (all labs ordered are listed, but only abnormal results are displayed) Labs Reviewed  COMPREHENSIVE METABOLIC PANEL  CBC WITH DIFFERENTIAL/PLATELET    EKG None  Radiology No results found.  Procedures Procedures (including critical care time)  Medications Ordered in ED Medications  sodium chloride 0.9 % bolus 500 mL (has no administration in time range)  albuterol (VENTOLIN HFA) 108 (90 Base) MCG/ACT inhaler 4 puff (has no administration in time range)  Initial Impression / Assessment and Plan / ED Course  I have reviewed the triage vital signs and the nursing notes.  Pertinent labs & imaging results that were available during my care of the patient were reviewed by me and considered in my medical decision making (see chart for details).  Clinical Course as of Mar 18 2058  Thu Mar 19, 2019  [id number] 63 year old with idiopathic pulmonary fibrosis presenting with worsening dyspnea since the 14th of this month not improved with doxycycline and steroids.  Tested positive for COVID-19 on Monday and subsequently got worse.  Had video visit with her pulmonologist Dr. Isaiah Serge today who wanted her admitted to [facility].  Patient initially declined but then got worse again at home and came to the emergency department for further treatment.  She is on her normal 4 L of oxygen and has normal oxygen sats but has been borderline tachypneic and hypotensive since has been here.  Some fluids were given and she is mildly improved.  Has a leukopenia of 2.7 potassium 3.3 .  Spoke with hospitalist to admit the patient and have her transferred to [place] due to her severe chronic lung condition.   [KM]    Clinical Course User Index [KM] Arlyn Dunning, PA-C       The patient appears reasonably stabilized for admission considering the current resources, flow, and capabilities available in the ED at this time, and I doubt any other [facility] requiring further screening and/or treatment in the ED prior to admission.   Final Clinical Impressions(s) / ED Diagnoses   Final diagnoses:  None    ED Discharge Orders    None       Jeral Pinch 03/19/19 2059    Vanetta Mulders, MD 03/30/19 [phone]

## 2019-03-19 NOTE — ED Notes (Signed)
[facility] pharmacy states we do not carry Ofev.

## 2019-03-20 ENCOUNTER — Other Ambulatory Visit: Payer: Self-pay

## 2019-03-20 ENCOUNTER — Encounter (HOSPITAL_COMMUNITY): Payer: Self-pay

## 2019-03-20 DIAGNOSIS — Z148 Genetic carrier of other disease: Secondary | ICD-10-CM

## 2019-03-20 DIAGNOSIS — D72819 Decreased white blood cell count, unspecified: Secondary | ICD-10-CM

## 2019-03-20 DIAGNOSIS — F419 Anxiety disorder, unspecified: Secondary | ICD-10-CM

## 2019-03-20 DIAGNOSIS — D72810 Lymphocytopenia: Secondary | ICD-10-CM

## 2019-03-20 DIAGNOSIS — D649 Anemia, unspecified: Secondary | ICD-10-CM

## 2019-03-20 DIAGNOSIS — J84112 Idiopathic pulmonary fibrosis: Secondary | ICD-10-CM

## 2019-03-20 DIAGNOSIS — J849 Interstitial pulmonary disease, unspecified: Secondary | ICD-10-CM

## 2019-03-20 LAB — COMPREHENSIVE METABOLIC PANEL
ALT: 20 U/L (ref 0–44)
AST: 24 U/L (ref 15–41)
Albumin: 3 g/dL — ABNORMAL LOW (ref 3.5–5.0)
Alkaline Phosphatase: 54 U/L (ref 38–126)
Anion gap: 7 (ref 5–15)
BUN: 9 mg/dL (ref 8–23)
CO2: 26 mmol/L (ref 22–32)
Calcium: 8.4 mg/dL — ABNORMAL LOW (ref 8.9–10.3)
Chloride: 108 mmol/L (ref 98–111)
Creatinine, Ser: 0.49 mg/dL (ref 0.44–1.00)
GFR calc Af Amer: >60 mL/min (ref >60)
GFR calc non Af Amer: >60 mL/min (ref >60)
Glucose, Bld: 141 mg/dL — ABNORMAL HIGH (ref 70–99)
Potassium: 3.7 mmol/L (ref 3.5–5.1)
Sodium: 141 mmol/L (ref 135–145)
Total Bilirubin: 0.6 mg/dL (ref 0.3–1.2)
Total Protein: 6 g/dL — ABNORMAL LOW (ref 6.5–8.1)

## 2019-03-20 LAB — CBC WITH DIFFERENTIAL/PLATELET
Abs Immature Granulocytes: 0.02 10*3/uL (ref 0.00–0.07)
Basophils Absolute: 0 10*3/uL (ref 0.0–0.1)
Basophils Relative: 0 %
Eosinophils Absolute: 0 10*3/uL (ref 0.0–0.5)
Eosinophils Relative: 0 %
HCT: 40.4 % (ref 36.0–46.0)
Hemoglobin: 13.2 g/dL (ref 12.0–15.0)
Immature Granulocytes: 1 %
Lymphocytes Relative: 21 %
Lymphs Abs: 0.4 10*3/uL — ABNORMAL LOW (ref 0.7–4.0)
MCH: 32.4 pg (ref 26.0–34.0)
MCHC: 32.7 g/dL (ref 30.0–36.0)
MCV: 99 fL (ref 80.0–100.0)
Monocytes Absolute: 0 10*3/uL — ABNORMAL LOW (ref 0.1–1.0)
Monocytes Relative: 2 %
Neutro Abs: 1.4 10*3/uL — ABNORMAL LOW (ref 1.7–7.7)
Neutrophils Relative %: 76 %
Platelets: 104 10*3/uL — ABNORMAL LOW (ref 150–400)
RBC: 4.08 MIL/uL (ref 3.87–5.11)
RDW: 12.1 % (ref 11.5–15.5)
WBC: 1.8 10*3/uL — ABNORMAL LOW (ref 4.0–10.5)
nRBC: 0 % (ref 0.0–0.2)

## 2019-03-20 LAB — ABO/RH: ABO/RH(D): B POS

## 2019-03-20 LAB — HIV ANTIBODY (ROUTINE TESTING W REFLEX): HIV Screen 4th Generation wRfx: NONREACTIVE

## 2019-03-20 LAB — C-REACTIVE PROTEIN: CRP: <0.8 mg/dL (ref <1.0)

## 2019-03-20 LAB — FERRITIN: Ferritin: 230 ng/mL (ref 11–307)

## 2019-03-20 LAB — D-DIMER, QUANTITATIVE: D-Dimer, Quant: <0.27 ug/mL-FEU (ref 0.00–0.50)

## 2019-03-20 MED ORDER — SODIUM CHLORIDE 0.9 % IV SOLN
100.0000 mg | INTRAVENOUS | Status: AC
  Administered 2019-03-21 – 2019-03-24 (×4): 100 mg via INTRAVENOUS
  Filled 2019-03-20 (×4): qty 20

## 2019-03-20 MED ORDER — SODIUM CHLORIDE 0.9 % IV SOLN
200.0000 mg | Freq: Once | INTRAVENOUS | Status: AC
  Administered 2019-03-20: 200 mg via INTRAVENOUS
  Filled 2019-03-20: qty 40

## 2019-03-20 MED ORDER — GUAIFENESIN 100 MG/5ML PO SOLN
200.0000 mg | Freq: Three times a day (TID) | ORAL | Status: DC | PRN
  Administered 2019-03-20 – 2019-03-22 (×3): 200 mg via ORAL
  Filled 2019-03-20 (×3): qty 15

## 2019-03-20 MED ORDER — CLONAZEPAM 1 MG PO TABS
1.0000 mg | ORAL_TABLET | Freq: Every day | ORAL | Status: DC
  Administered 2019-03-20 – 2019-03-23 (×4): 1 mg via ORAL
  Filled 2019-03-20 (×4): qty 1

## 2019-03-20 MED ORDER — ADULT MULTIVITAMIN W/MINERALS CH
1.0000 | ORAL_TABLET | Freq: Every day | ORAL | Status: DC
  Administered 2019-03-20 – 2019-03-24 (×5): 1 via ORAL
  Filled 2019-03-20 (×5): qty 1

## 2019-03-20 MED ORDER — GUAIFENESIN ER 600 MG PO TB12
600.0000 mg | ORAL_TABLET | Freq: Two times a day (BID) | ORAL | Status: DC
  Administered 2019-03-20 – 2019-03-24 (×8): 600 mg via ORAL
  Filled 2019-03-20 (×8): qty 1

## 2019-03-20 MED ORDER — MULTI-VITAMIN/MINERALS PO TABS: 1.0000 | ORAL_TABLET | Freq: Every day | ORAL | Status: DC

## 2019-03-20 MED ORDER — CALCIUM CARBONATE ANTACID 750 MG PO CHEW: 1.0000 | CHEWABLE_TABLET | Freq: Every day | ORAL | Status: DC | PRN

## 2019-03-20 MED ORDER — ALBUTEROL SULFATE (2.5 MG/3ML) 0.083% IN NEBU
2.5000 mg | INHALATION_SOLUTION | Freq: Four times a day (QID) | RESPIRATORY_TRACT | Status: DC | PRN
  Filled 2019-03-20: qty 3

## 2019-03-20 MED ORDER — CALCIUM CARBONATE ANTACID 500 MG PO CHEW
1.0000 | CHEWABLE_TABLET | Freq: Every day | ORAL | Status: DC | PRN
  Administered 2019-03-21: 400 mg via ORAL
  Filled 2019-03-20: qty 2

## 2019-03-20 NOTE — Progress Notes (Signed)
RN spoke wit pt.'s son Herbie Baltimore. Updates given. No further concerns.

## 2019-03-20 NOTE — Progress Notes (Signed)
Progress Note  Amber Lloyd [id number]:[id number] DOB: 07/09/1955 DOA: 03/19/2019  PCP: Shawnie Dapper, PA-C   Patient coming from: Home  I have personally briefly reviewed patient's old medical records in [facility] Health Link  Chief Complaint: Difficulty breathing, Covid positive  HPI: Amber Lloyd is a 63 y.o. female with medical history significant for pulmonary emphysema chronic respiratory failure, alpha-1 antitrypsin, idiopathic pulmonary fibrosis, anxiety and depression. Patient reports onset of difficulty breathing 10/14, she was given a course of steroids and doxycycline with temporary improvement.  And then symptoms worsened.  She also had some diarrhea.  Patient's foster children were diagnosed with Covid 19 on the 16th and 19th.  She got tested on 25th and was positive.  With increasing difficulty breathing, ongoing chills fatigue body aches, patient presented to the ED. Patient talked to her pulmonologist Dr. Isaiah Serge prior to admission, he recommended admission to [facility], but at the time patient was hesitant. In ED O2 sats greater than 99% on home 4 L.  Blood pressure systolic 102-108, temperature 36.6.  WBC 2.7.  Potassium 3.3.  Portable abdominal x-ray, for reports of diarrhea, negative for acute abnormality.  Portable chest x-ray still pending.  Hospitalist to admit for Covid infection.  Subjective:  No acute issues or events overnight, patient continues to complain of pleuritic chest pain with deep inspiration and nausea but declines headache, fevers, chills, diarrhea, constipation.  Assessment/Plan  Active Problems:   Anxiety   Anemia   Pulmonary emphysema (HCC)   Alpha-1-antitrypsin deficiency carrier   Interstitial pulmonary disease (HCC)   IPF (idiopathic pulmonary fibrosis) (HCC)   Pneumonia due to COVID-19 virus   Leukopenia      COVID-19 pneumonia, with chronic respiratory failure, POA -Clinically continues to complain of's of breath and dyspnea with even  minimal exertion -Continues at 4 L nasal cannula which is her baseline oxygen requirement -IV dexamethasone 6 mg IV daily for 10 days -Procalcitonin negative, consider Actemra if CRP climbs or patient clinically worsens -Continue remdesivir x5 days, 10/26 -Pleuritic chest pain, EKG this morning unremarkable for ST elevations or depressions -Albuterol inhalers as needed SpO2: 100 % O2 Flow Rate (L/min): 4 L/min  Recent Labs    03/19/19 1652 03/19/19 2027 03/20/19 0516  DDIMER  --  <0.27 <0.27  FERRITIN 196  --  230  LDH  --  151  --   CRP 0.9  --  <0.8    Pulmonary emphysema, idiopathic pulmonary fibrosis- history of alpha-1 antitrypsin deficiency.  With chronic respiratory failure on 4 L. -Supplemental O2, -Resume home nintedanib, patient reports compliance  Leukopenia Thrombocytopenia -Likely in the setting of viral pneumonia as above will follow with repeat labs  Depression, anxiety -Resume home sertraline  DVT prophylaxis: Lovenox Code Status: Full code Family Communication: None at bedside  Disposition Plan: Per rounding team Consults called: None Admission status: Inpatient, telemetry I certify that at the point of admission it is my clinical judgment that the patient will require inpatient hospital care spanning beyond 2 midnights from the point of admission due to high intensity of service, high risk for further deterioration and high frequency of surveillance required. The following factors support the patient status of inpatient: COVID-19 pneumonia, requiring IV antivirals and close monitoring, high risk for decompensation.  Physical Exam: Vitals:   03/20/19 0030 03/20/19 0100 03/20/19 0400 03/20/19 0500  BP: 91/63 95/69 90/61  93/62  Pulse: 64 61 (!) 53 (!) 56  Resp: 16 16 14 14   Temp:  TempSrc:      SpO2: 95% 98% 99% 100%  Weight:      Height:        Constitutional: NAD, calm, comfortable Vitals:   03/20/19 0030 03/20/19 0100 03/20/19 0400 03/20/19  0500  BP: 91/63 95/69 90/61  93/62  Pulse: 64 61 (!) 53 (!) 56  Resp: 16 16 14 14   Temp:      TempSrc:      SpO2: 95% 98% 99% 100%  Weight:      Height:       General:  Pleasantly resting in bed, No acute distress. HEENT:  Normocephalic atraumatic.  Sclerae nonicteric, noninjected.  Extraocular movements intact bilaterally. Neck:  Without mass or deformity.  Trachea is midline. Lungs: Coarse breath sounds bilaterally without rhonchi, wheeze, or rales. Heart:  Regular rate and rhythm.  Without murmurs, rubs, or gallops. Abdomen:  Soft, nontender, nondistended.  Without guarding or rebound. Extremities: Without cyanosis, clubbing, edema, or obvious deformity. Vascular:  Dorsalis pedis and posterior tibial pulses palpable bilaterally. Skin:  Warm and dry, no erythema, no ulcerations.   Labs on Admission: I have personally reviewed following labs and imaging studies  CBC: Recent Labs  Lab 03/19/19 1652 03/20/19 0516  WBC 2.7* 1.8*  NEUTROABS 2.0 1.4*  HGB 13.1 13.2  HCT 40.8 40.4  MCV 100.5* 99.0  PLT 106* 270*   Basic Metabolic Panel: Recent Labs  Lab 03/19/19 1652 03/20/19 0516  NA 137 141  K 3.3* 3.7  CL 104 108  CO2 28 26  GLUCOSE 98 141*  BUN 10 9  CREATININE 0.63 0.49  CALCIUM 7.9* 8.4*   Liver Function Tests: Recent Labs  Lab 03/19/19 1652 03/20/19 0516  AST 24 24  ALT 20 20  ALKPHOS 58 54  BILITOT 0.6 0.6  PROT 5.8* 6.0*  ALBUMIN 3.1* 3.0*   Radiological Exams on Admission: Dg Chest Portable 1 View  Result Date: 03/19/2019 CLINICAL DATA:  Cough.  COVID-19 EXAM: PORTABLE CHEST 1 VIEW COMPARISON:  Radiograph 05/13/2017. CT 11/19/2017 FINDINGS: Cardiomegaly, there is also retrocardiac hiatal hernia. Peripheral subpleural opacities consistent with interstitial lung disease. There are vague bibasilar opacities. No pleural effusion or pneumothorax. No acute osseous abnormalities are seen. IMPRESSION: 1. Chronic interstitial lung disease. 2. Vague  bibasilar opacities may be related to interstitial lung disease or known COVID-19 pneumonia. 3. Cardiomegaly and retrocardiac hiatal hernia. Electronically Signed   By: Keith Rake M.D.   On: 03/19/2019 21:27   Dg Abd Portable 1 View  Result Date: 03/19/2019 CLINICAL DATA:  63 year old female with history of shortness of breath. COVID positive. EXAM: PORTABLE ABDOMEN - 1 VIEW COMPARISON:  No priors. FINDINGS: The bowel gas pattern is normal. No radio-opaque calculi or other significant radiographic abnormality are seen. IMPRESSION: Negative. Electronically Signed   By: Vinnie Langton M.D.   On: 03/19/2019 17:33    EKG: Independently reviewed.   Little Ishikawa DO [facility]  03/20/2019, 8:39 AM

## 2019-03-20 NOTE — Progress Notes (Signed)
0645 Pt arrived via stretcher from [facility]. NADN Telemetry monitor applied. Pt oriented to room. Will report to on coming RN

## 2019-03-20 NOTE — Progress Notes (Signed)
Pharmacy Medication Storage Note  Storing home medications for Amber Lloyd in pharmacy secured storage.   Medication storage bag number: [id number]  Delivered to pharmacy @ 17:21 (time) by Malena Catholic   Medications will be returned to patient/caregiver upon discharge.  Peggyann Juba, PharmD, BCPS 03/20/19 5:19 PM

## 2019-03-21 DIAGNOSIS — J439 Emphysema, unspecified: Secondary | ICD-10-CM

## 2019-03-21 LAB — COMPREHENSIVE METABOLIC PANEL
ALT: 17 U/L (ref 0–44)
AST: 31 U/L (ref 15–41)
Albumin: 3.1 g/dL — ABNORMAL LOW (ref 3.5–5.0)
Alkaline Phosphatase: 47 U/L (ref 38–126)
Anion gap: 9 (ref 5–15)
BUN: 16 mg/dL (ref 8–23)
CO2: 26 mmol/L (ref 22–32)
Calcium: 8.4 mg/dL — ABNORMAL LOW (ref 8.9–10.3)
Chloride: 107 mmol/L (ref 98–111)
Creatinine, Ser: 0.67 mg/dL (ref 0.44–1.00)
GFR calc Af Amer: >60 mL/min (ref >60)
GFR calc non Af Amer: >60 mL/min (ref >60)
Glucose, Bld: 126 mg/dL — ABNORMAL HIGH (ref 70–99)
Potassium: 4.1 mmol/L (ref 3.5–5.1)
Sodium: 142 mmol/L (ref 135–145)
Total Bilirubin: 0.9 mg/dL (ref 0.3–1.2)
Total Protein: 6 g/dL — ABNORMAL LOW (ref 6.5–8.1)

## 2019-03-21 LAB — CBC WITH DIFFERENTIAL/PLATELET
Abs Immature Granulocytes: 0.02 10*3/uL (ref 0.00–0.07)
Basophils Absolute: 0 10*3/uL (ref 0.0–0.1)
Basophils Relative: 0 %
Eosinophils Absolute: 0 10*3/uL (ref 0.0–0.5)
Eosinophils Relative: 0 %
HCT: 40.4 % (ref 36.0–46.0)
Hemoglobin: 13.3 g/dL (ref 12.0–15.0)
Immature Granulocytes: 0 %
Lymphocytes Relative: 13 %
Lymphs Abs: 0.6 10*3/uL — ABNORMAL LOW (ref 0.7–4.0)
MCH: 32.6 pg (ref 26.0–34.0)
MCHC: 32.9 g/dL (ref 30.0–36.0)
MCV: 99 fL (ref 80.0–100.0)
Monocytes Absolute: 0.2 10*3/uL (ref 0.1–1.0)
Monocytes Relative: 4 %
Neutro Abs: 3.9 10*3/uL (ref 1.7–7.7)
Neutrophils Relative %: 83 %
Platelets: 96 10*3/uL — ABNORMAL LOW (ref 150–400)
RBC: 4.08 MIL/uL (ref 3.87–5.11)
RDW: 12.5 % (ref 11.5–15.5)
WBC: 4.7 10*3/uL (ref 4.0–10.5)
nRBC: 0 % (ref 0.0–0.2)

## 2019-03-21 LAB — D-DIMER, QUANTITATIVE: D-Dimer, Quant: 0.4 ug/mL-FEU (ref 0.00–0.50)

## 2019-03-21 LAB — C-REACTIVE PROTEIN: CRP: <0.8 mg/dL (ref <1.0)

## 2019-03-21 LAB — FERRITIN: Ferritin: 231 ng/mL (ref 11–307)

## 2019-03-21 MED ORDER — IPRATROPIUM-ALBUTEROL 20-100 MCG/ACT IN AERS
1.0000 | INHALATION_SPRAY | Freq: Four times a day (QID) | RESPIRATORY_TRACT | Status: DC
  Administered 2019-03-21 – 2019-03-22 (×3): 1 via RESPIRATORY_TRACT
  Filled 2019-03-21: qty 4

## 2019-03-21 MED ORDER — LACTATED RINGERS IV BOLUS
1000.0000 mL | Freq: Once | INTRAVENOUS | Status: AC
  Administered 2019-03-21: 1000 mL via INTRAVENOUS

## 2019-03-21 MED ORDER — INFLUENZA VAC SPLIT QUAD 0.5 ML IM SUSY
0.5000 mL | PREFILLED_SYRINGE | INTRAMUSCULAR | Status: DC
  Filled 2019-03-21: qty 0.5

## 2019-03-21 MED ORDER — ORAL CARE MOUTH RINSE
15.0000 mL | Freq: Two times a day (BID) | OROMUCOSAL | Status: DC
  Administered 2019-03-21 – 2019-03-24 (×8): 15 mL via OROMUCOSAL

## 2019-03-21 NOTE — Progress Notes (Signed)
Progress Note  Amber Lloyd [id number]:[id number] DOB: August 31, 1955 DOA: 03/19/2019  PCP: Shawnie Dapper, PA-C   Patient coming from: Home  I have personally briefly reviewed patient's old medical records in [facility] Link  Chief Complaint: Difficulty breathing, Covid positive  HPI: Amber Lloyd is a 63 y.o. female with medical history significant for pulmonary emphysema chronic respiratory failure, alpha-1 antitrypsin, idiopathic pulmonary fibrosis, anxiety and depression. Patient reports onset of difficulty breathing 10/14, she was given a course of steroids and doxycycline with temporary improvement.  And then symptoms worsened.  She also had some diarrhea.  Patient's foster children were diagnosed with Covid 19 on the 16th and 19th.  She got tested on 25th and was positive.  With increasing difficulty breathing, ongoing chills fatigue body aches, patient presented to the ED. Patient talked to her pulmonologist Dr. Isaiah Serge prior to admission, he recommended admission to [facility], but at the time patient was hesitant. In ED O2 sats greater than 99% on home 4 L.  Blood pressure systolic 102-108, temperature 17.7.  WBC 2.7.  Potassium 3.3.  Portable abdominal x-ray, for reports of diarrhea, negative for acute abnormality.  Portable chest x-ray still pending.  Hospitalist to admit for Covid infection.  Subjective:  No acute issues or events overnight, patient continues to complain of pleuritic chest pain with deep inspiration and nausea but declines headache, fevers, chills, diarrhea, constipation.  Assessment/Plan  Active Problems:   Anxiety   Anemia   Pulmonary emphysema (HCC)   Alpha-1-antitrypsin deficiency carrier   Interstitial pulmonary disease (HCC)   IPF (idiopathic pulmonary fibrosis) (HCC)   Pneumonia due to COVID-19 virus   Leukopenia    COVID-19 pneumonia, with chronic hypoxic respiratory failure, POA -Clinically continues to complain of's of breath and dyspnea with  even minimal exertion -Continues at 4 L nasal cannula which is her baseline oxygen requirement -IV dexamethasone 6 mg IV daily for 10 days -Procalcitonin negative, consider Actemra if CRP climbs or patient clinically worsens -Continue remdesivir x5 days, 10/26 -Pleuritic chest pain resolving, previous EKG this morning unremarkable for ST elevations or depressions -Albuterol inhalers as needed SpO2: 97 % O2 Flow Rate (L/min): 4 L/min Recent Labs    03/19/19 1652 03/19/19 2027 03/20/19 0516 03/21/19 0250  DDIMER  --  <0.27 <0.27 0.40  FERRITIN 196  --  230 231  LDH  --  151  --   --   CRP 0.9  --  <0.8 <0.8    Hypotension, likely acute dehydration on chronic orthostatic hypotension -Blood pressure moderately low this morning map around 60-65 -Continue IV fluids LR at 250 cc/hr x 1 L -Continue to encourage p.o. intake -Orthostatic precautions ongoing -ambulating only with assistance  Pulmonary emphysema, idiopathic pulmonary fibrosis- history of alpha-1 antitrypsin deficiency.  With chronic respiratory failure on 4 L. -Supplemental O2, -Resume home nintedanib, patient reports compliance  Leukopenia Thrombocytopenia -Likely in the setting of viral pneumonia as above will follow with repeat labs  Depression, anxiety -Resume home sertraline  DVT prophylaxis: Lovenox Code Status: Full code Family Communication: None at bedside  Disposition Plan: Per rounding team Consults called: None Admission status: Inpatient, telemetry I certify that at the point of admission it is my clinical judgment that the patient will require inpatient hospital care spanning beyond 2 midnights from the point of admission due to high intensity of service, high risk for further deterioration and high frequency of surveillance required. The following factors support the patient status of inpatient:  COVID-19 pneumonia, requiring IV antivirals and close monitoring, high risk for decompensation.  Physical  Exam: Vitals:   03/21/19 1003 03/21/19 1100 03/21/19 1146 03/21/19 1200  BP: 99/68 94/63 97/66  95/68  Pulse: 80 70 70 65  Resp: (!) 24 19 18 19   Temp: 97.8 F (36.6 C) 97.7 F (36.5 C) 98.3 F (36.8 C) 98.4 F (36.9 C)  TempSrc: Oral Oral Oral Oral  SpO2: 95% 97% 96% 97%  Weight:      Height:        Constitutional: NAD, calm, comfortable Vitals:   03/21/19 1003 03/21/19 1100 03/21/19 1146 03/21/19 1200  BP: 99/68 94/63 97/66  95/68  Pulse: 80 70 70 65  Resp: (!) 24 19 18 19   Temp: 97.8 F (36.6 C) 97.7 F (36.5 C) 98.3 F (36.8 C) 98.4 F (36.9 C)  TempSrc: Oral Oral Oral Oral  SpO2: 95% 97% 96% 97%  Weight:      Height:       General:  Pleasantly resting in bed, No acute distress. HEENT:  Normocephalic atraumatic.  Sclerae nonicteric, noninjected.  Extraocular movements intact bilaterally. Neck:  Without mass or deformity.  Trachea is midline. Lungs: Coarse breath sounds bilaterally without rhonchi, wheeze, or rales. Heart:  Regular rate and rhythm.  Without murmurs, rubs, or gallops. Abdomen:  Soft, nontender, nondistended.  Without guarding or rebound. Extremities: Without cyanosis, clubbing, edema, or obvious deformity. Vascular:  Dorsalis pedis and posterior tibial pulses palpable bilaterally. Skin:  Warm and dry, no erythema, no ulcerations.   Labs on Admission: I have personally reviewed following labs and imaging studies  CBC: Recent Labs  Lab 03/19/19 1652 03/20/19 0516 03/21/19 0250  WBC 2.7* 1.8* 4.7  NEUTROABS 2.0 1.4* 3.9  HGB 13.1 13.2 13.3  HCT 40.8 40.4 40.4  MCV 100.5* 99.0 99.0  PLT 106* 104* 96*   Basic Metabolic Panel: Recent Labs  Lab 03/19/19 1652 03/20/19 0516 03/21/19 0250  NA 137 141 142  K 3.3* 3.7 4.1  CL 104 108 107  CO2 28 26 26   GLUCOSE 98 141* 126*  BUN 10 9 16   CREATININE 0.63 0.49 0.67  CALCIUM 7.9* 8.4* 8.4*   Liver Function Tests: Recent Labs  Lab 03/19/19 1652 03/20/19 0516 03/21/19 0250  AST 24 24 31    ALT 20 20 17   ALKPHOS 58 54 47  BILITOT 0.6 0.6 0.9  PROT 5.8* 6.0* 6.0*  ALBUMIN 3.1* 3.0* 3.1*   Radiological Exams on Admission: Dg Chest Portable 1 View  Result Date: 03/19/2019 CLINICAL DATA:  Cough.  COVID-19 EXAM: PORTABLE CHEST 1 VIEW COMPARISON:  Radiograph 05/13/2017. CT 11/19/2017 FINDINGS: Cardiomegaly, there is also retrocardiac hiatal hernia. Peripheral subpleural opacities consistent with interstitial lung disease. There are vague bibasilar opacities. No pleural effusion or pneumothorax. No acute osseous abnormalities are seen. IMPRESSION: 1. Chronic interstitial lung disease. 2. Vague bibasilar opacities may be related to interstitial lung disease or known COVID-19 pneumonia. 3. Cardiomegaly and retrocardiac hiatal hernia. Electronically Signed   By: Keith Rake M.D.   On: 03/19/2019 21:27   Dg Abd Portable 1 View  Result Date: 03/19/2019 CLINICAL DATA:  63 year old female with history of shortness of breath. COVID positive. EXAM: PORTABLE ABDOMEN - 1 VIEW COMPARISON:  No priors. FINDINGS: The bowel gas pattern is normal. No radio-opaque calculi or other significant radiographic abnormality are seen. IMPRESSION: Negative. Electronically Signed   By: Vinnie Langton M.D.   On: 03/19/2019 17:33    EKG: Independently reviewed.   Little Ishikawa  DO [facility]  03/21/2019, 4:06 PM

## 2019-03-22 LAB — COMPREHENSIVE METABOLIC PANEL
ALT: 20 U/L (ref 0–44)
AST: 31 U/L (ref 15–41)
Albumin: 3.1 g/dL — ABNORMAL LOW (ref 3.5–5.0)
Alkaline Phosphatase: 49 U/L (ref 38–126)
Anion gap: 7 (ref 5–15)
BUN: 19 mg/dL (ref 8–23)
CO2: 25 mmol/L (ref 22–32)
Calcium: 8.4 mg/dL — ABNORMAL LOW (ref 8.9–10.3)
Chloride: 109 mmol/L (ref 98–111)
Creatinine, Ser: 0.58 mg/dL (ref 0.44–1.00)
GFR calc Af Amer: >60 mL/min (ref >60)
GFR calc non Af Amer: >60 mL/min (ref >60)
Glucose, Bld: 122 mg/dL — ABNORMAL HIGH (ref 70–99)
Potassium: 3.7 mmol/L (ref 3.5–5.1)
Sodium: 141 mmol/L (ref 135–145)
Total Bilirubin: 1.2 mg/dL (ref 0.3–1.2)
Total Protein: 5.8 g/dL — ABNORMAL LOW (ref 6.5–8.1)

## 2019-03-22 LAB — CBC WITH DIFFERENTIAL/PLATELET
Abs Immature Granulocytes: 0.02 10*3/uL (ref 0.00–0.07)
Basophils Absolute: 0 10*3/uL (ref 0.0–0.1)
Basophils Relative: 0 %
Eosinophils Absolute: 0 10*3/uL (ref 0.0–0.5)
Eosinophils Relative: 0 %
HCT: 39 % (ref 36.0–46.0)
Hemoglobin: 12.7 g/dL (ref 12.0–15.0)
Immature Granulocytes: 0 %
Lymphocytes Relative: 12 %
Lymphs Abs: 0.7 10*3/uL (ref 0.7–4.0)
MCH: 32.2 pg (ref 26.0–34.0)
MCHC: 32.6 g/dL (ref 30.0–36.0)
MCV: 99 fL (ref 80.0–100.0)
Monocytes Absolute: 0.3 10*3/uL (ref 0.1–1.0)
Monocytes Relative: 5 %
Neutro Abs: 5.1 10*3/uL (ref 1.7–7.7)
Neutrophils Relative %: 83 %
Platelets: 94 10*3/uL — ABNORMAL LOW (ref 150–400)
RBC: 3.94 MIL/uL (ref 3.87–5.11)
RDW: 12.5 % (ref 11.5–15.5)
WBC: 6.1 10*3/uL (ref 4.0–10.5)
nRBC: 0 % (ref 0.0–0.2)

## 2019-03-22 LAB — C-REACTIVE PROTEIN: CRP: <0.8 mg/dL (ref <1.0)

## 2019-03-22 LAB — D-DIMER, QUANTITATIVE: D-Dimer, Quant: 0.32 ug/mL-FEU (ref 0.00–0.50)

## 2019-03-22 LAB — FERRITIN: Ferritin: 240 ng/mL (ref 11–307)

## 2019-03-22 MED ORDER — LOPERAMIDE HCL 2 MG PO CAPS
2.0000 mg | ORAL_CAPSULE | ORAL | Status: DC | PRN
  Administered 2019-03-22 – 2019-03-24 (×3): 2 mg via ORAL
  Filled 2019-03-22 (×3): qty 1

## 2019-03-22 MED ORDER — TIOTROPIUM BROMIDE-OLODATEROL 2.5-2.5 MCG/ACT IN AERS
1.0000 | INHALATION_SPRAY | Freq: Two times a day (BID) | RESPIRATORY_TRACT | Status: DC
  Administered 2019-03-22 – 2019-03-24 (×4): 1 via RESPIRATORY_TRACT

## 2019-03-22 MED ORDER — NON FORMULARY: 1.0000 | Freq: Two times a day (BID) | Status: DC

## 2019-03-22 MED ORDER — PRO-STAT SUGAR FREE PO LIQD
30.0000 mL | Freq: Two times a day (BID) | ORAL | Status: DC
  Administered 2019-03-22 – 2019-03-24 (×5): 30 mL via ORAL
  Filled 2019-03-22 (×5): qty 30

## 2019-03-22 MED ORDER — HOME MED STORE IN PYXIS: 2.0000 | Freq: Every day | Status: DC

## 2019-03-22 MED ORDER — SODIUM CHLORIDE 0.9 % IV BOLUS
250.0000 mL | Freq: Once | INTRAVENOUS | Status: AC
  Administered 2019-03-22: 250 mL via INTRAVENOUS

## 2019-03-22 NOTE — Progress Notes (Signed)
Progress Note  Amber Lloyd FFM:[id number] DOB: 06/25/1955 DOA: 03/19/2019  PCP: Cory Munch, PA-C   Patient coming from: Home  I have personally briefly reviewed patient's old medical records in [place]  Chief Complaint: Difficulty breathing, Covid positive  HPI: Amber Lloyd is a 63 y.o. female with medical history significant for pulmonary emphysema chronic respiratory failure, alpha-1 antitrypsin, idiopathic pulmonary fibrosis, anxiety and depression. Patient reports onset of difficulty breathing 10/14, she was given a course of steroids and doxycycline with temporary improvement.  And then symptoms worsened.  She also had some diarrhea.  Patient's foster children were diagnosed with Covid 19 on the 16th and 19th.  She got tested on 25th and was positive.  With increasing difficulty breathing, ongoing chills fatigue body aches, patient presented to the ED. Patient talked to her pulmonologist Dr. Vaughan Browner prior to admission, he recommended admission to [facility], but at the time patient was hesitant. In ED O2 sats greater than 99% on home 4 L.  Blood pressure systolic 570-177, temperature 98.6.  WBC 2.7.  Potassium 3.3.  Portable abdominal x-ray, for reports of diarrhea, negative for acute abnormality.  Portable chest x-ray still pending.  Hospitalist to admit for Covid infection.  Subjective:  No acute issues or events overnight - ongoing low blood pressure but patient remains asymptomatic; denies chest pain, headache, fever, chills, diarrhea, or constipation.  Assessment/Plan  Active Problems:   Anxiety   Anemia   Pulmonary emphysema (HCC)   Alpha-1-antitrypsin deficiency carrier   Interstitial pulmonary disease (HCC)   IPF (idiopathic pulmonary fibrosis) (HCC)   Pneumonia due to COVID-19 virus   Leukopenia   COVID-19 pneumonia, with chronic hypoxic respiratory failure, POA -Clinically continues to complain of's of breath and dyspnea with even minimal exertion  -Continues at 4 L nasal cannula which is her baseline oxygen requirement -IV dexamethasone 6 mg IV daily for 10 days -Procalcitonin negative, consider Actemra if CRP climbs or patient clinically worsens -Continue remdesivir x5 days, stop date 10/27 -Pleuritic chest pain resolving, previous EKG this morning unremarkable for ST elevations or depressions -Albuterol inhalers as needed SpO2: 98 % O2 Flow Rate (L/min): 4 L/min Recent Labs    03/19/19 2027 03/20/19 0516 03/21/19 0250 03/22/19 0027  DDIMER <0.27 <0.27 0.40 0.32  FERRITIN  --  230 231 240  LDH 151  --   --   --   CRP  --  <0.8 <0.8 <0.8    Hypotension, likely acute dehydration on chronic orthostatic hypotension -Blood pressure moderately improving over the past 24 hours with IV fluids and increased p.o. intake -Continue to encourage p.o. intake -Orthostatic precautions ongoing -ambulating only with assistance  Pulmonary emphysema, idiopathic pulmonary fibrosis- history of alpha-1 antitrypsin deficiency.  With chronic respiratory failure on 4 L. -Supplemental O2, -Resume home nintedanib, patient reports compliance  Leukopenia resolving Thrombocytopenia stable -Likely in the setting of acute viral pneumonia as above will follow with repeat labs  Depression, anxiety -Continue home sertraline  DVT prophylaxis: Lovenox Code Status: Full code Family Communication: None at bedside  Disposition Plan: Pending clinical status and improvement, may benefit from SNF placement versus home with home health given her weakness, orthostatic hypotension and increased fall risk. Consults called: None Admission status: Inpatient, telemetry I certify that at the point of admission it is my clinical judgment that the patient will require inpatient hospital care spanning beyond 2 midnights from the point of admission due to high intensity of service, high risk  for further deterioration and high frequency of surveillance required. The  following factors support the patient status of inpatient: COVID-19 pneumonia, requiring IV antivirals and close monitoring, high risk for decompensation.  Physical Exam: Vitals:   03/22/19 0300 03/22/19 0416 03/22/19 0739 03/22/19 1142  BP: (!) 77/52 (!) 83/50 (!) 94/58   Pulse: 65 65 63   Resp: 16 18 18    Temp:  98 F (36.7 C) 97.6 F (36.4 C) 98.2 F (36.8 C)  TempSrc:  Oral Oral Oral  SpO2: 98% 97% 98%   Weight:      Height:       General:  Pleasantly resting in bed, No acute distress. HEENT:  Normocephalic atraumatic.  Sclerae nonicteric, noninjected.  Extraocular movements intact bilaterally. Neck:  Without mass or deformity.  Trachea is midline. Lungs: Coarse breath sounds bilaterally without rhonchi, wheeze, or rales. Heart:  Regular rate and rhythm.  Without murmurs, rubs, or gallops. Abdomen:  Soft, nontender, nondistended.  Without guarding or rebound. Extremities: Without cyanosis, clubbing, edema, or obvious deformity. Vascular:  Dorsalis pedis and posterior tibial pulses palpable bilaterally. Skin:  Warm and dry, no erythema, no ulcerations.   Labs on Admission: I have personally reviewed following labs and imaging studies  CBC: Recent Labs  Lab 03/19/19 1652 03/20/19 0516 03/21/19 0250 03/22/19 0027  WBC 2.7* 1.8* 4.7 6.1  NEUTROABS 2.0 1.4* 3.9 5.1  HGB 13.1 13.2 13.3 12.7  HCT 40.8 40.4 40.4 39.0  MCV 100.5* 99.0 99.0 99.0  PLT 106* 104* 96* 94*   Basic Metabolic Panel: Recent Labs  Lab 03/19/19 1652 03/20/19 0516 03/21/19 0250 03/22/19 0027  NA 137 141 142 141  K 3.3* 3.7 4.1 3.7  CL 104 108 107 109  CO2 28 26 26 25   GLUCOSE 98 141* 126* 122*  BUN 10 9 16 19   CREATININE 0.63 0.49 0.67 0.58  CALCIUM 7.9* 8.4* 8.4* 8.4*   Liver Function Tests: Recent Labs  Lab 03/19/19 1652 03/20/19 0516 03/21/19 0250 03/22/19 0027  AST 24 24 31 31   ALT 20 20 17 20   ALKPHOS 58 54 47 49  BILITOT 0.6 0.6 0.9 1.2  PROT 5.8* 6.0* 6.0* 5.8*  ALBUMIN  3.1* 3.0* 3.1* 3.1*    EKG: Independently reviewed.   03/22/19 DO [facility]  03/22/2019, 3:21 PM

## 2019-03-23 LAB — COMPREHENSIVE METABOLIC PANEL
ALT: 21 U/L (ref 0–44)
AST: 23 U/L (ref 15–41)
Albumin: 2.9 g/dL — ABNORMAL LOW (ref 3.5–5.0)
Alkaline Phosphatase: 42 U/L (ref 38–126)
Anion gap: 11 (ref 5–15)
BUN: 20 mg/dL (ref 8–23)
CO2: 24 mmol/L (ref 22–32)
Calcium: 8.3 mg/dL — ABNORMAL LOW (ref 8.9–10.3)
Chloride: 107 mmol/L (ref 98–111)
Creatinine, Ser: 0.62 mg/dL (ref 0.44–1.00)
GFR calc Af Amer: >60 mL/min (ref >60)
GFR calc non Af Amer: >60 mL/min (ref >60)
Glucose, Bld: 129 mg/dL — ABNORMAL HIGH (ref 70–99)
Potassium: 3.9 mmol/L (ref 3.5–5.1)
Sodium: 142 mmol/L (ref 135–145)
Total Bilirubin: 0.2 mg/dL — ABNORMAL LOW (ref 0.3–1.2)
Total Protein: 5.6 g/dL — ABNORMAL LOW (ref 6.5–8.1)

## 2019-03-23 LAB — CBC WITH DIFFERENTIAL/PLATELET
Abs Immature Granulocytes: 0.01 10*3/uL (ref 0.00–0.07)
Basophils Absolute: 0 10*3/uL (ref 0.0–0.1)
Basophils Relative: 0 %
Eosinophils Absolute: 0 10*3/uL (ref 0.0–0.5)
Eosinophils Relative: 0 %
HCT: 41.6 % (ref 36.0–46.0)
Hemoglobin: 13.5 g/dL (ref 12.0–15.0)
Immature Granulocytes: 0 %
Lymphocytes Relative: 20 %
Lymphs Abs: 0.8 10*3/uL (ref 0.7–4.0)
MCH: 32.4 pg (ref 26.0–34.0)
MCHC: 32.5 g/dL (ref 30.0–36.0)
MCV: 99.8 fL (ref 80.0–100.0)
Monocytes Absolute: 0.3 10*3/uL (ref 0.1–1.0)
Monocytes Relative: 7 %
Neutro Abs: 2.9 10*3/uL (ref 1.7–7.7)
Neutrophils Relative %: 73 %
Platelets: 107 10*3/uL — ABNORMAL LOW (ref 150–400)
RBC: 4.17 MIL/uL (ref 3.87–5.11)
RDW: 12.6 % (ref 11.5–15.5)
WBC: 4 10*3/uL (ref 4.0–10.5)
nRBC: 0 % (ref 0.0–0.2)

## 2019-03-23 LAB — GLUCOSE, CAPILLARY: Glucose-Capillary: 101 mg/dL — ABNORMAL HIGH (ref 70–99)

## 2019-03-23 LAB — D-DIMER, QUANTITATIVE: D-Dimer, Quant: 0.31 ug/mL-FEU (ref 0.00–0.50)

## 2019-03-23 LAB — C-REACTIVE PROTEIN: CRP: <0.8 mg/dL (ref <1.0)

## 2019-03-23 LAB — FERRITIN: Ferritin: 237 ng/mL (ref 11–307)

## 2019-03-23 MED ORDER — SODIUM CHLORIDE 0.9 % IV BOLUS
250.0000 mL | Freq: Once | INTRAVENOUS | Status: AC
  Administered 2019-03-23: 250 mL via INTRAVENOUS

## 2019-03-23 MED ORDER — NINTEDANIB ESYLATE 150 MG PO CAPS
150.0000 mg | ORAL_CAPSULE | Freq: Two times a day (BID) | ORAL | Status: DC
  Administered 2019-03-23 – 2019-03-24 (×2): 150 mg via ORAL
  Filled 2019-03-23: qty 1

## 2019-03-23 NOTE — Progress Notes (Signed)
   03/23/19 0138  Vitals  BP (!) 88/59    Contacted covering MD R. Shanon Brow. About BP. Will continue to monitor.

## 2019-03-23 NOTE — Progress Notes (Signed)
   03/23/19 0400  Vitals  BP (!) 85/53  MAP (mmHg) (!) 64   Contacted R. Shanon Brow MD about BP. Loralee Pacas contacted this nurse and gave a verbal order to give a 250 ml bolus of NS. Lear Ng RN witnessed verbal order.

## 2019-03-23 NOTE — Progress Notes (Signed)
Occupational Therapy Evaluation Patient Details Name: Amber Lloyd MRN: [id number] DOB: April 21, 1956 Today's Date: 03/23/2019    History of Present Illness 63 year old female coming from home with history of idiopathic pulmonary fibrosis on chronic 4 L of oxygen, anxiety, anemia, depression, GERD presenting to the emergency department for shortness of breath in the context of testing positive for Covid.     Clinical Impression   "I feel so much better". PTA, pt lived at home with her 3 grand children (older) and her son. States she was on 4L at baseline and becomes SOB easily. Has assistance for IADL tasks form family. Uses either a cane or her power wc in the home "depending on how I feel". Session completed on 4L with desat to 87 with functional mobility @ 10 ft and ADL.Quickly rebounds into low 90s with pursed lip breathing. BP soft (see vitals) but no complaints of dizziness; does not appear orthostatic.  Pt states it is not "abnornal for her O2 to drop into the 70s" before getting sick. Incentive spirometer and flutter valve exercises completed. Began education on theraband HEP. Feel pt will be appropriate to DC home with [place] and [place]. Will follow acutely. Pt very appreciative.   Recommend pt ambulate to bathroom with staff @ RW level.     Follow Up Recommendations  Home health OT;Supervision - Intermittent(S with mobility)    Equipment Recommendations  3 in 1 bedside commode;Other (comment)(RW)    Recommendations for Other Services PT consult     Precautions / Restrictions Precautions Precautions: Fall Precaution Comments: watch BP      Mobility Bed Mobility               General bed mobility comments: OOB in chair  Transfers Overall transfer level: Needs assistance   Transfers: Sit to/from Stand Sit to Stand: Supervision              Balance Overall balance assessment: Needs assistance   Sitting balance-Leahy Scale: Good       Standing  balance-Leahy Scale: Fair                             ADL either performed or assessed with clinical judgement   ADL Overall ADL's : Needs assistance/impaired Eating/Feeding: Modified independent   Grooming: Set up;Sitting   Upper Body Bathing: Set up;Sitting   Lower Body Bathing: Minimal assistance;Sit to/from stand   Upper Body Dressing : Set up;Sitting   Lower Body Dressing: Minimal assistance;Sit to/from stand   Toilet Transfer: Min guard;RW;Ambulation;BSC   Toileting- Water quality scientist and Hygiene: Supervision/safety;Sit to/from stand       Functional mobility during ADLs: Min guard;Rolling walker;Cueing for safety       Vision Baseline Vision/History: Wears glasses       Perception     Praxis      Pertinent Vitals/Pain Pain Assessment: No/denies pain     Hand Dominance Right   Extremity/Trunk Assessment Upper Extremity Assessment Upper Extremity Assessment: Generalized weakness   Lower Extremity Assessment Lower Extremity Assessment: Defer to PT evaluation   Cervical / Trunk Assessment Cervical / Trunk Assessment: Normal   Communication Communication Communication: No difficulties   Cognition Arousal/Alertness: Awake/alert Behavior During Therapy: WFL for tasks assessed/performed Overall Cognitive Status: Within Functional Limits for tasks assessed  General Comments       Exercises Exercises: Other exercises Other Exercises Other Exercises: incentive spirometer x 10 - able to pull 1200 ml Other Exercises: fultter valve x 10 Other Exercises: began education on level 2 theraband HPE   Shoulder Instructions      Home Living Family/patient expects to be discharged to:: Private residence Living Arrangements: Children;Spouse/significant other(26; 17; 16 grand children) Available Help at Discharge: Family Type of Home: House Home Access: Stairs to enter;Ramped entrance      Home Layout: Two level;Able to live on main level with bedroom/bathroom(does not go up/down stairs due to breathing)     Bathroom Shower/Tub: Other (comment)(claw foot tub)   Bathroom Toilet: Standard Bathroom Accessibility: Yes How Accessible: Accessible via walker;Accessible via wheelchair Home Equipment: Wheelchair - power;Cane - single point;Transport chair          Prior Functioning/Environment Level of Independence: Independent with assistive device(s);Needs assistance    ADL's / Homemaking Assistance Needed: can bath and dress herself but has family assist her with getting in/out of tub; family assists with meals/housekeeping            OT Problem List: Decreased strength;Decreased activity tolerance;Impaired balance (sitting and/or standing);Decreased safety awareness;Decreased knowledge of use of DME or AE;Cardiopulmonary status limiting activity;Pain      OT Treatment/Interventions: Self-care/ADL training;Therapeutic exercise;Neuromuscular education;Energy conservation;DME and/or AE instruction;Therapeutic activities;Cognitive remediation/compensation;Patient/family education;Balance training    OT Goals(Current goals can be found in the care plan section) Acute Rehab OT Goals Patient Stated Goal: to get stronger OT Goal Formulation: With patient Time For Goal Achievement: 04/06/19 Potential to Achieve Goals: Good  OT Frequency: Min 3X/week   Barriers to D/C:            Co-evaluation              AM-PAC OT "6 Clicks" Daily Activity     Outcome Measure Help from another person eating meals?: None Help from another person taking care of personal grooming?: A Little Help from another person toileting, which includes using toliet, bedpan, or urinal?: A Little Help from another person bathing (including washing, rinsing, drying)?: A Little Help from another person to put on and taking off regular upper body clothing?: A Little Help from another person to  put on and taking off regular lower body clothing?: A Little 6 Click Score: 19   End of Session Equipment Utilized During Treatment: Oxygen(4L) Nurse Communication: Mobility status  Activity Tolerance: Patient tolerated treatment well Patient left: in chair;with call bell/phone within reach  OT Visit Diagnosis: Unsteadiness on feet (R26.81);Muscle weakness (generalized) (M62.81);Pain Pain - part of body: (general discomfort)                Time: 1410-1445 OT Time Calculation (min): 35 min Charges:  OT General Charges $OT Visit: 1 Visit OT Evaluation $OT Eval Moderate Complexity: 1 Mod OT Treatments $Self Care/Home Management : 8-22 mins  Luisa Dago, OT/L   Acute OT Clinical Specialist Acute Rehabilitation Services Pager [phone] Office [phone]   [facility] 03/23/2019, 5:04 PM

## 2019-03-23 NOTE — Progress Notes (Signed)
Progress Note  Amber Lloyd IRC:[id number] DOB: 1956/01/25 DOA: 03/19/2019  PCP: Cory Munch, PA-C   Patient coming from: Home  I have personally briefly reviewed patient's old medical records in [place]  Chief Complaint: Difficulty breathing, Covid positive  HPI: Amber Lloyd is a 63 y.o. female with medical history significant for pulmonary emphysema chronic respiratory failure, alpha-1 antitrypsin, idiopathic pulmonary fibrosis, anxiety and depression. Patient reports onset of difficulty breathing 10/14, she was given a course of steroids and doxycycline with temporary improvement.  And then symptoms worsened.  She also had some diarrhea.  Patient's foster children were diagnosed with Covid 19 on the 16th and 19th.  She got tested on 25th and was positive.  With increasing difficulty breathing, ongoing chills fatigue body aches, patient presented to the ED. Patient talked to her pulmonologist Dr. Vaughan Browner prior to admission, he recommended admission to [facility], but at the time patient was hesitant. In ED O2 sats greater than 99% on home 4 L.  Blood pressure systolic 510-258, temperature 98.6.  WBC 2.7.  Potassium 3.3.  Portable abdominal x-ray, for reports of diarrhea, negative for acute abnormality.  Portable chest x-ray still pending.  Hospitalist to admit for Covid infection.  Subjective:  Ongoing low blood pressure overnight but patient remains asymptomatic; denies chest pain, headache, fever, chills, diarrhea, or constipation.  Assessment/Plan  Active Problems:   Anxiety   Anemia   Pulmonary emphysema (HCC)   Alpha-1-antitrypsin deficiency carrier   Interstitial pulmonary disease (HCC)   IPF (idiopathic pulmonary fibrosis) (HCC)   Pneumonia due to COVID-19 virus   Leukopenia    COVID-19 pneumonia, with chronic hypoxic respiratory failure, POA -Clinically continues to complain of dyspnea with even minimal exertion, moderate improvement over the past 24  hours -Continues at 4 L nasal cannula which is her baseline oxygen requirement -IV dexamethasone 6 mg IV daily for 10 days -stop 11/1 -Procalcitonin negative, consider Actemra if CRP climbs or patient clinically worsens -Continue remdesivir x5 days, stop date 10/27 -Pleuritic chest pain resolving, previous EKG this morning unremarkable for ST elevations or depressions -Albuterol inhalers as needed SpO2: 99 % O2 Flow Rate (L/min): 4 L/min Recent Labs    03/21/19 0250 03/22/19 0027 03/23/19 0255  DDIMER 0.40 0.32  --   FERRITIN 231 240 237  CRP <0.8 <0.8 <0.8    Hypotension, likely acute dehydration on chronic orthostatic hypotension -Blood pressure moderately improving over the past 24 hours with IV fluids and increased p.o. intake -Continue to encourage p.o. intake -Orthostatic precautions ongoing -ambulating only with assistance  Pulmonary emphysema, idiopathic pulmonary fibrosis - history of alpha-1 antitrypsin deficiency.   - chronic respiratory failure on 4 L. -Supplemental O2, -Resume home nintedanib, patient reports compliance  Leukopenia resolving Thrombocytopenia stable -Likely in the setting of acute viral pneumonia as above will follow with repeat labs  Depression, anxiety -Continue home sertraline  DVT prophylaxis: Lovenox Code Status: Full code Disposition Plan: Pending clinical status and improvement, may benefit from SNF placement versus home with home health given her weakness, orthostatic hypotension and increased fall risk. Consults called: None Admission status: Inpatient, telemetry: COVID-19 pneumonia, requiring IV antivirals and close monitoring, high risk for decompensation given advanced age and co-morbidities as above.  Physical Exam: Vitals:   03/23/19 0100 03/23/19 0138 03/23/19 0400 03/23/19 0600  BP: (!) 82/58 (!) 88/59 (!) 85/53 (!) 95/55  Pulse: 65  60   Resp: 19  16   Temp: 98.1 F (36.7  C)  98.4 F (36.9 C)   TempSrc: Oral  Oral    SpO2: 98%  99%   Weight:      Height:       General:  Pleasantly resting in bed, No acute distress. HEENT:  Normocephalic atraumatic.  Sclerae nonicteric, noninjected.  Extraocular movements intact bilaterally. Neck:  Without mass or deformity.  Trachea is midline. Lungs: Coarse breath sounds bilaterally without rhonchi, wheeze, or rales. Heart:  Regular rate and rhythm.  Without murmurs, rubs, or gallops. Abdomen:  Soft, nontender, nondistended.  Without guarding or rebound. Extremities: Without cyanosis, clubbing, edema, or obvious deformity. Vascular:  Dorsalis pedis and posterior tibial pulses palpable bilaterally. Skin:  Warm and dry, no erythema, no ulcerations.   Labs on Admission: I have personally reviewed following labs and imaging studies  CBC: Recent Labs  Lab 03/19/19 1652 03/20/19 0516 03/21/19 0250 03/22/19 0027  WBC 2.7* 1.8* 4.7 6.1  NEUTROABS 2.0 1.4* 3.9 5.1  HGB 13.1 13.2 13.3 12.7  HCT 40.8 40.4 40.4 39.0  MCV 100.5* 99.0 99.0 99.0  PLT 106* 104* 96* 94*   Basic Metabolic Panel: Recent Labs  Lab 03/19/19 1652 03/20/19 0516 03/21/19 0250 03/22/19 0027 03/23/19 0255  NA 137 141 142 141 142  K 3.3* 3.7 4.1 3.7 3.9  CL 104 108 107 109 107  CO2 28 26 26 25 24   GLUCOSE 98 141* 126* 122* 129*  BUN 10 9 16 19 20   CREATININE 0.63 0.49 0.67 0.58 0.62  CALCIUM 7.9* 8.4* 8.4* 8.4* 8.3*   Liver Function Tests: Recent Labs  Lab 03/19/19 1652 03/20/19 0516 03/21/19 0250 03/22/19 0027 03/23/19 0255  AST 24 24 31 31 23   ALT 20 20 17 20 21   ALKPHOS 58 54 47 49 42  BILITOT 0.6 0.6 0.9 1.2 0.2*  PROT 5.8* 6.0* 6.0* 5.8* 5.6*  ALBUMIN 3.1* 3.0* 3.1* 3.1* 2.9*    EKG: Independently reviewed.   03/24/19 DO [facility]  03/23/2019, 8:27 AM

## 2019-03-24 DIAGNOSIS — D509 Iron deficiency anemia, unspecified: Secondary | ICD-10-CM

## 2019-03-24 LAB — COMPREHENSIVE METABOLIC PANEL
ALT: 23 U/L (ref 0–44)
AST: 21 U/L (ref 15–41)
Albumin: 2.8 g/dL — ABNORMAL LOW (ref 3.5–5.0)
Alkaline Phosphatase: 47 U/L (ref 38–126)
Anion gap: 9 (ref 5–15)
BUN: 18 mg/dL (ref 8–23)
CO2: 26 mmol/L (ref 22–32)
Calcium: 8.2 mg/dL — ABNORMAL LOW (ref 8.9–10.3)
Chloride: 108 mmol/L (ref 98–111)
Creatinine, Ser: 0.56 mg/dL (ref 0.44–1.00)
GFR calc Af Amer: >60 mL/min (ref >60)
GFR calc non Af Amer: >60 mL/min (ref >60)
Glucose, Bld: 132 mg/dL — ABNORMAL HIGH (ref 70–99)
Potassium: 3.4 mmol/L — ABNORMAL LOW (ref 3.5–5.1)
Sodium: 143 mmol/L (ref 135–145)
Total Bilirubin: 0.5 mg/dL (ref 0.3–1.2)
Total Protein: 5.4 g/dL — ABNORMAL LOW (ref 6.5–8.1)

## 2019-03-24 LAB — D-DIMER, QUANTITATIVE: D-Dimer, Quant: 0.29 ug/mL-FEU (ref 0.00–0.50)

## 2019-03-24 LAB — CBC WITH DIFFERENTIAL/PLATELET
Abs Immature Granulocytes: 0.02 10*3/uL (ref 0.00–0.07)
Basophils Absolute: 0 10*3/uL (ref 0.0–0.1)
Basophils Relative: 0 %
Eosinophils Absolute: 0 10*3/uL (ref 0.0–0.5)
Eosinophils Relative: 0 %
HCT: 37.5 % (ref 36.0–46.0)
Hemoglobin: 12.2 g/dL (ref 12.0–15.0)
Immature Granulocytes: 0 %
Lymphocytes Relative: 12 %
Lymphs Abs: 0.6 10*3/uL — ABNORMAL LOW (ref 0.7–4.0)
MCH: 32.1 pg (ref 26.0–34.0)
MCHC: 32.5 g/dL (ref 30.0–36.0)
MCV: 98.7 fL (ref 80.0–100.0)
Monocytes Absolute: 0.2 10*3/uL (ref 0.1–1.0)
Monocytes Relative: 4 %
Neutro Abs: 4.3 10*3/uL (ref 1.7–7.7)
Neutrophils Relative %: 84 %
Platelets: 108 10*3/uL — ABNORMAL LOW (ref 150–400)
RBC: 3.8 MIL/uL — ABNORMAL LOW (ref 3.87–5.11)
RDW: 12.5 % (ref 11.5–15.5)
WBC: 5.1 10*3/uL (ref 4.0–10.5)
nRBC: 0 % (ref 0.0–0.2)

## 2019-03-24 LAB — FERRITIN: Ferritin: 211 ng/mL (ref 11–307)

## 2019-03-24 LAB — C-REACTIVE PROTEIN: CRP: <0.8 mg/dL (ref <1.0)

## 2019-03-24 MED ORDER — PRO-STAT SUGAR FREE PO LIQD: 30.0000 mL | Freq: Two times a day (BID) | ORAL | 0 refills | Status: AC

## 2019-03-24 MED ORDER — PREDNISONE 10 MG PO TABS: ORAL_TABLET | ORAL | 0 refills | Status: AC

## 2019-03-24 NOTE — TOC Transition Note (Signed)
Transition of Care [facility]) - CM/SW Discharge Note   Patient Details  Name: NIJAH TEJERA MRN: [id number] Date of Birth: 1955/06/10  [facility] CM/SW Contact:  Weston Anna, LCSW Phone Number: 03/24/2019, 10:46 AM   Clinical Narrative:     CSW spoke with patients son, Herbie [place], regarding discharge plans. Patient has previously used [facility] for [place] services and hospice services- he would prefer to use them again. Referral completed and accepted with [facility].   Patient has current orders for DME 3n1 and walker; per [place] patients insurance does not cover DME needs and recommends patient reaching out to the [place] to determine if they can provide DME for patient. CSW notified son who voiced understanding and had no concerns- patient uses cane while at home.   Final next level of care: Home w Home Health Services Barriers to Discharge: No Barriers Identified   Patient Goals and CMS Choice        Discharge Placement                       Discharge Plan and Services                DME Arranged: N/A         HH Arranged: PT HH Agency: [place] Date [place]: 03/24/19 Time HH Agency Contacted: 37 Representative spoke with at [place]: cherly  Social Determinants of Health ([place]) Interventions     Readmission Risk Interventions No flowsheet data found.

## 2019-03-24 NOTE — Progress Notes (Signed)
   03/24/19 0305  Vitals  BP (!) 87/57  MAP (mmHg) 68   Contacted Service, R. Shanon Brow, about patient's BP. Will continue to monitor.

## 2019-03-24 NOTE — Discharge Summary (Signed)
Physician Discharge Summary  Amber Lloyd [id number] DOB: [birth year] DOA: 03/19/2019  PCP: Shawnie DapperMann, Benjamin L, PA-C  Admit date: 03/19/2019 Discharge date: 03/24/2019  Admitted From: Home Disposition: Home with home health  Recommendations for Outpatient Follow-up:  1. Follow up with PCP in 1-2 weeks 2. Please obtain BMP/CBC in one week  Home Health: Yes Equipment/Devices: 3in1, commode, rolling walker  Discharge Condition: Guarded CODE STATUS: Full Diet recommendation: As tolerated  Brief/Interim Summary: Amber Lloyd is a 63 y.o. female with medical history significant for pulmonary emphysema chronic respiratory failure, alpha-1 antitrypsin, idiopathic pulmonary fibrosis, anxiety and depression. Patient reports onset of difficulty breathing 10/14, she was given a course of steroids and doxycycline with temporary improvement.  And then symptoms worsened.  She also had some diarrhea.  Patient's foster children were diagnosed with Covid 19 on the 16th and 19th.  She got tested on 25th and was positive.  With increasing difficulty breathing, ongoing chills fatigue body aches, patient presented to the ED. Patient talked to her pulmonologist Dr. Isaiah SergeMannam prior to admission, he [facility], but at the time patient was hesitant. In ED O2 sats greater than 99% on home 4 L.  Blood pressure systolic 102-108, temperature 84.698.6.  WBC 2.7.  Potassium 3.3.  Portable abdominal x-ray, for reports of diarrhea, negative for acute abnormality.  Portable chest x-ray still pending.  Hospitalist to admit for Covid infection.  Patient admitted as above for acute Covid pneumonia, without hypoxia although markedly symptomatic with minimal exertion.  As to 72 hours patient has continued to improve daily.  Now completed remdesivir course, will continue steroid taper at discharge.  She remains at high risk for worsening respiratory status given her baseline respiratory status in the setting of  alpha-1 antitrypsin and idiopathic pulmonary fibrosis history.  She will need close follow-up with PCP as well as pulmonologist Dr. Reinaldo BerberMennam as scheduled.  Would recommend continuation of all medications as previously prescribed by pulmonology and PCP with the addition of a prolonged steroid taper given her baseline respiratory status.  She has been evaluated by PT deemed safe for discharge back home with home health and supplies as above.  She otherwise feels quite well, nearly back to baseline and agreeable for discharge home.    Discharge Diagnoses:  Active Problems:   Anxiety   Anemia   Pulmonary emphysema (HCC)   Alpha-1-antitrypsin deficiency carrier   Interstitial pulmonary disease (HCC)   IPF (idiopathic pulmonary fibrosis) (HCC)   Pneumonia due to COVID-19 virus   Leukopenia    Discharge Instructions   Allergies as of 03/24/2019   No Known Allergies     Medication List    STOP taking these medications   dexamethasone 6 MG tablet Commonly known as: DECADRON   doxycycline 100 MG capsule Commonly known as: VIBRAMYCIN   doxycycline 100 MG EC tablet Commonly known as: DORYX   methylPREDNISolone 4 MG Tbpk tablet Commonly known as: MEDROL DOSEPAK     TAKE these medications   Advil 200 MG tablet Generic drug: ibuprofen Take 200 mg by mouth daily as needed. For shoulder pain and headaches   clonazePAM 1 MG tablet Commonly known as: KLONOPIN TAKE 1 TABLET BY MOUTH AT BEDTIME   feeding supplement (PRO-STAT SUGAR FREE 64) Liqd Take 30 mLs by mouth 2 (two) times daily.   guaiFENesin 600 MG 12 hr tablet Commonly known as: MUCINEX Take 600 mg by mouth 2 (two) times daily.   guaiFENesin 100 MG/5ML liquid Commonly known as:  ROBITUSSIN Take 200 mg by mouth 3 (three) times daily as needed for cough.   IRON PO Take 1 tablet by mouth daily.   multivitamin capsule Take 1 capsule by mouth daily.   multivitamin with minerals tablet Take 1 tablet by mouth daily.    Ofev 150 MG Caps Generic drug: Nintedanib Take 150 mg by mouth 2 (two) times daily.   omeprazole 40 MG capsule Commonly known as: PRILOSEC Take 1 capsule (40 mg total) by mouth daily.   OXYGEN Inhale 4 L into the lungs continuous.   predniSONE 10 MG tablet Commonly known as: DELTASONE Take 4 tablets (40 mg total) by mouth daily for 4 days, THEN 3 tablets (30 mg total) daily for 4 days, THEN 2 tablets (20 mg total) daily for 4 days, THEN 1 tablet (10 mg total) daily for 4 days, THEN 0.5 tablets (5 mg total) daily for 4 days. Start taking on: March 24, 2019   Proventil HFA 108 (90 Base) MCG/ACT inhaler Generic drug: albuterol INHALE 2 PUFFS BY MOUTH EVERY 6 HOURS AS NEEDED FOR COUGHING, WHEEZING, OR SHORTNESS OF BREATH What changed: See the new instructions.   albuterol (2.5 MG/3ML) 0.083% nebulizer solution Commonly known as: PROVENTIL INHALE THE CONTENTS OF ONE VIAL VIA NEBULIZER EVERY 6 HOURS AS NEEDED FOR WHEEZING OR SHORTNESS OF BREATH What changed: See the new instructions.   sertraline 50 MG tablet Commonly known as: ZOLOFT Take 2 tablets (100 mg total) by mouth daily.   Tiotropium Bromide-Olodaterol 2.5-2.5 MCG/ACT Aers Commonly known as: Stiolto Respimat Inhale 2 puffs into the lungs daily.   Tums Chewy Bites 750 MG chewable tablet Generic drug: calcium carbonate Chew 1-2 tablets by mouth daily as needed for heartburn.       No Known Allergies   Procedures/Studies: Dg Chest Portable 1 View  Result Date: 03/19/2019 CLINICAL DATA:  Cough.  COVID-19 EXAM: PORTABLE CHEST 1 VIEW COMPARISON:  Radiograph 05/13/2017. CT 11/19/2017 FINDINGS: Cardiomegaly, there is also retrocardiac hiatal hernia. Peripheral subpleural opacities consistent with interstitial lung disease. There are vague bibasilar opacities. No pleural effusion or pneumothorax. No acute osseous abnormalities are seen. IMPRESSION: 1. Chronic interstitial lung disease. 2. Vague bibasilar opacities may be  related to interstitial lung disease or known COVID-19 pneumonia. 3. Cardiomegaly and retrocardiac hiatal hernia. Electronically Signed   By: Narda Rutherford M.D.   On: 03/19/2019 21:27   Dg Abd Portable 1 View  Result Date: 03/19/2019 CLINICAL DATA:  63 year old female with history of shortness of breath. COVID positive. EXAM: PORTABLE ABDOMEN - 1 VIEW COMPARISON:  No priors. FINDINGS: The bowel gas pattern is normal. No radio-opaque calculi or other significant radiographic abnormality are seen. IMPRESSION: Negative. Electronically Signed   By: Trudie Reed M.D.   On: 03/19/2019 17:33    Subjective: No acute issues or events overnight, patient feels quite well, nearly back to baseline, ambulating without severe dyspnea or fatigue as previous.  Patient otherwise chest pain, nausea, vomiting, diarrhea, constipation, headache, fevers, chills.   Discharge Exam: Vitals:   03/24/19 0100 03/24/19 0305  BP: (!) 91/57 (!) 87/57  Pulse: 79 74  Resp: 17 16  Temp: 98.3 F (36.8 C) 98.5 F (36.9 C)  SpO2: 98% 95%   Vitals:   03/23/19 1556 03/23/19 2035 03/24/19 0100 03/24/19 0305  BP: 111/69 104/61 (!) 91/57 (!) 87/57  Pulse: 85 75 79 74  Resp: 20 14 17 16   Temp: 97.6 F (36.4 C) 98.5 F (36.9 C) 98.3 F (36.8 C) 98.5 F (  36.9 C)  TempSrc: Oral Oral Oral Oral  SpO2: 96% 96% 98% 95%  Weight:      Height:        General:  Pleasantly resting in bed, No acute distress. HEENT:  Normocephalic atraumatic.  Sclerae nonicteric, noninjected.  Extraocular movements intact bilaterally. Neck:  Without mass or deformity.  Trachea is midline. Lungs: Diminished bilaterally without rhonchi, wheeze, or rales. Heart:  Regular rate and rhythm.  Without murmurs, rubs, or gallops. Abdomen:  Soft, nontender, nondistended.  Without guarding or rebound. Extremities: Without cyanosis, clubbing, edema, or obvious deformity. Vascular:  Dorsalis pedis and posterior tibial pulses palpable  bilaterally. Skin:  Warm and dry, no erythema, no ulcerations.   The results of significant diagnostics from this hospitalization (including imaging, microbiology, ancillary and laboratory) are listed below for reference.     Microbiology: Recent Results (from the past 240 hour(s))  SARS Coronavirus 2 by RT PCR (hospital order, performed in [facility] hospital lab) Nasopharyngeal Nasopharyngeal Swab     Status: Abnormal   Collection Time: 03/19/19  5:39 PM   Specimen: Nasopharyngeal Swab  Result Value Ref Range Status   SARS Coronavirus 2 POSITIVE (A) NEGATIVE Final    Comment: CRITICAL RESULT CALLED TO, READ BACK BY AND VERIFIED WITH: ELLIS,C AT 2259 ON 10.22.20 BY ISLEY,B (NOTE) If result is NEGATIVE SARS-CoV-2 target nucleic acids are NOT DETECTED. The SARS-CoV-2 RNA is generally detectable in upper and lower  respiratory specimens during the acute phase of infection. The lowest  concentration of SARS-CoV-2 viral copies this assay can detect is 250  copies / mL. A negative result does not preclude SARS-CoV-2 infection  and should not be used as the sole basis for treatment or other  patient management decisions.  A negative result may occur with  improper specimen collection / handling, submission of specimen other  than nasopharyngeal swab, presence of viral mutation(s) within the  areas targeted by this assay, and inadequate number of viral copies  (<250 copies / mL). A negative result must be combined with clinical  observations, patient history, and epidemiological information. If result is POSITIVE SARS-CoV-2 target nucleic acids are  DETECTED. The SARS-CoV-2 RNA is generally detectable in upper and lower  respiratory specimens during the acute phase of infection.  Positive  results are indicative of active infection with SARS-CoV-2.  Clinical  correlation with patient history and other diagnostic information is  necessary to determine patient infection status.  Positive  results do  not rule out bacterial infection or co-infection with other viruses. If result is PRESUMPTIVE POSTIVE SARS-CoV-2 nucleic acids MAY BE PRESENT.   A presumptive positive result was obtained on the submitted specimen  and confirmed on repeat testing.  While 2019 novel coronavirus  (SARS-CoV-2) nucleic acids may be present in the submitted sample  additional confirmatory testing may be necessary for epidemiological  and / or clinical management purposes  to differentiate between  SARS-CoV-2 and other Sarbecovirus currently known to infect humans.  If clinically indicated additional testing with an alternate test  methodology  [phone]) is advised. The SARS-CoV-2 RNA is generally  detectable in upper and lower respiratory specimens during the acute  phase of infection. The expected result is Negative. Fact Sheet for Patients:  StrictlyIdeas.no Fact Sheet for Healthcare Providers: BankingDealers.co.za This test is not yet approved or cleared by the [place] FDA and has been authorized for detection and/or diagnosis of SARS-CoV-2 by FDA under an Emergency Use Authorization (EUA).  This EUA will remain in  effect (meaning this test can be used) for the duration of the COVID-19 declaration under Section 564(b)(1) of the Act, 21 U.S.C. section 360bbb-3(b)(1), unless the authorization is terminated or revoked sooner. Performed at [facility], [address], [place], [place] [address]      Labs:  Basic Metabolic Panel: Recent Labs  Lab 03/20/19 0516 03/21/19 0250 03/22/19 0027 03/23/19 0255 03/24/19 0408  NA 141 142 141 142 143  K 3.7 4.1 3.7 3.9 3.4*  CL 108 107 109 107 108  CO2 26 26 25 24 26   GLUCOSE 141* 126* 122* 129* 132*  BUN 9 16 19 20 18   CREATININE 0.49 0.67 0.58 0.62 0.56  CALCIUM 8.4* 8.4* 8.4* 8.3* 8.2*   Liver Function Tests: Recent Labs  Lab 03/20/19 0516 03/21/19 0250 03/22/19 0027 03/23/19 0255  03/24/19 0408  AST 24 31 31 23 21   ALT 20 17 20 21 23   ALKPHOS 54 47 49 42 47  BILITOT 0.6 0.9 1.2 0.2* 0.5  PROT 6.0* 6.0* 5.8* 5.6* 5.4*  ALBUMIN 3.0* 3.1* 3.1* 2.9* 2.8*   No results for input(s): LIPASE, AMYLASE in the last 168 hours. No results for input(s): AMMONIA in the last 168 hours. CBC: Recent Labs  Lab 03/20/19 0516 03/21/19 0250 03/22/19 0027 03/23/19 0800 03/24/19 0408  WBC 1.8* 4.7 6.1 4.0 5.1  NEUTROABS 1.4* 3.9 5.1 2.9 4.3  HGB 13.2 13.3 12.7 13.5 12.2  HCT 40.4 40.4 39.0 41.6 37.5  MCV 99.0 99.0 99.0 99.8 98.7  PLT 104* 96* 94* 107* 108*   CBG: Recent Labs  Lab 03/20/19 2127  GLUCAP 101*   D-Dimer Recent Labs    03/23/19 0800 03/24/19 0408  DDIMER 0.31 0.29   Anemia work up Recent Labs    03/23/19 0255 03/24/19 0408  FERRITIN 237 211   Microbiology Recent Results (from the past 240 hour(s))  SARS Coronavirus 2 by RT PCR (hospital order, performed in [facility] hospital lab) Nasopharyngeal Nasopharyngeal Swab     Status: Abnormal   Collection Time: 03/19/19  5:39 PM   Specimen: Nasopharyngeal Swab  Result Value Ref Range Status   SARS Coronavirus 2 POSITIVE (A) NEGATIVE Final    Comment: CRITICAL RESULT CALLED TO, READ BACK BY AND VERIFIED WITH: ELLIS,C AT 2259 ON 10.22.20 BY ISLEY,B (NOTE) If result is NEGATIVE SARS-CoV-2 target nucleic acids are NOT DETECTED. The SARS-CoV-2 RNA is generally detectable in upper and lower  respiratory specimens during the acute phase of infection. The lowest  concentration of SARS-CoV-2 viral copies this assay can detect is 250  copies / mL. A negative result does not preclude SARS-CoV-2 infection  and should not be used as the sole basis for treatment or other  patient management decisions.  A negative result may occur with  improper specimen collection / handling, submission of specimen other  than nasopharyngeal swab, presence of viral mutation(s) within the  areas targeted by this assay, and  inadequate number of viral copies  (<250 copies / mL). A negative result must be combined with clinical  observations, patient history, and epidemiological information. If result is POSITIVE SARS-CoV-2 target nucleic acids are  DETECTED. The SARS-CoV-2 RNA is generally detectable in upper and lower  respiratory specimens during the acute phase of infection.  Positive  results are indicative of active infection with SARS-CoV-2.  Clinical  correlation with patient history and other diagnostic information is  necessary to determine patient infection status.  Positive results do  not rule out bacterial infection or  co-infection with other viruses. If result is PRESUMPTIVE POSTIVE SARS-CoV-2 nucleic acids MAY BE PRESENT.   A presumptive positive result was obtained on the submitted specimen  and confirmed on repeat testing.  While 2019 novel coronavirus  (SARS-CoV-2) nucleic acids may be present in the submitted sample  additional confirmatory testing may be necessary for epidemiological  and / or clinical management purposes  to differentiate between  SARS-CoV-2 and other Sarbecovirus currently known to infect humans.  If clinically indicated additional testing with an alternate test  methodology  [phone] is advised. The SARS-CoV-2 RNA is generally  detectable in upper and lower respiratory specimens during the acute  phase of infection. The expected result is Negative. Fact Sheet for Patients:  BoilerBrush.com.cyhttps://www.fda.gov/media/136312/download Fact Sheet for Healthcare Providers: https://pope.com/https://www.fda.gov/media/136313/download This test is not yet approved or cleared by the [place] FDA and has been authorized for detection and/or diagnosis of SARS-CoV-2 by FDA under an Emergency Use Authorization (EUA).  This EUA will remain in effect (meaning this test can be used) for the duration of the COVID-19 declaration under Section 564(b)(1) of the Act, 21 U.S.C. section 360bbb-3(b)(1), unless the  authorization is terminated or revoked sooner. Performed at [facility], [address] [place], [place] [address]      Time coordinating discharge: Over 30 minutes  SIGNED:  Azucena FallenWilliam C Serita Degroote, DO [facility] 03/24/2019, 8:31 AM Pager   If 7PM-7AM, please contact night-coverage [email or web address] Password [email or web address]

## 2019-03-24 NOTE — Evaluation (Signed)
Physical Therapy Evaluation Patient Details Name: Amber Lloyd MRN: [id number] DOB: 1955-10-12 Today's Date: 03/24/2019   History of Present Illness  63 year old female coming from home with history of idiopathic pulmonary fibrosis on chronic 4 L of oxygen, anxiety, anemia, depression, GERD presenting to the emergency department for shortness of breath in the context of testing positive for Covid.    Clinical Impression   Pt admitted with above diagnosis. PTA was home with family and independent with cane for ambulation and power chair for longer distances. Pt currently with functional limitations due to the deficits listed below (see PT Problem List). Pt is usually on 4L/min via [place] at baseline, this pm does well with mobility but noted desat to 82% with movement in room. Pt was able to sit and pursed lip breathe to increase 02 sats to 90s. Pt will benefit from skilled PT to increase their independence and safety with mobility to allow discharge to the venue listed below.       Follow Up Recommendations No PT follow up(at d/c)    Equipment Recommendations  None recommended by PT    Recommendations for Other Services       Precautions / Restrictions Precautions Precautions: Fall Precaution Comments: watch BP Restrictions Weight Bearing Restrictions: No      Mobility  Bed Mobility Overal bed mobility: Modified Independent                Transfers Overall transfer level: Needs assistance Equipment used: Rolling walker (2 wheeled) Transfers: Sit to/from Stand Sit to Stand: Supervision            Ambulation/Gait Ambulation/Gait assistance: Min guard Gait Distance (Feet): 100 Feet Assistive device: Rolling walker (2 wheeled) Gait Pattern/deviations: Step-through pattern     General Gait Details: extremely slow cadenced ambulation noted some IR of B feet  Stairs            Wheelchair Mobility    Modified Rankin (Stroke Patients Only)        Balance Overall balance assessment: Needs assistance Sitting-balance support: Feet unsupported Sitting balance-Leahy Scale: Good     Standing balance support: During functional activity Standing balance-Leahy Scale: Fair                               Pertinent Vitals/Pain Pain Assessment: No/denies pain    Home Living Family/patient expects to be discharged to:: Private residence Living Arrangements: Children;Spouse/significant other Available Help at Discharge: Family Type of Home: House Home Access: Stairs to enter;Ramped entrance     Home Layout: Two level;Able to live on main level with bedroom/bathroom Home Equipment: Wheelchair - power;Cane - single point;Transport chair      Prior Function Level of Independence: Independent with assistive device(s);Needs assistance   Gait / Transfers Assistance Needed: Pt was independent uses cane for short distance ambulation and power chair for longer distances  ADL's / Homemaking Assistance Needed: family able to assist as needed        Hand Dominance   Dominant Hand: Right    Extremity/Trunk Assessment   Upper Extremity Assessment Upper Extremity Assessment: Defer to OT evaluation    Lower Extremity Assessment Lower Extremity Assessment: Generalized weakness    Cervical / Trunk Assessment Cervical / Trunk Assessment: Normal  Communication   Communication: No difficulties  Cognition Arousal/Alertness: Awake/alert Behavior During Therapy: WFL for tasks assessed/performed Overall Cognitive Status: Within Functional Limits for tasks assessed  General Comments General comments (skin integrity, edema, etc.): Pt was living home with family and was quite independent using cane and power chair at times. She usually uses 4L/min if 02 via [place] and states that she generally desats with activity. During assessment pt states she felt anxious as she could not  find her charger to home 02 canister and desat to 82%, she was able to sit and pursed lip breathe to bring 02 back up to 90s. Pt was able to ambulate in hall approx 166ft with RW and min guard assist and managed to maintain sats in low 90s, on 4L/min via [place] measured via finger probe.    Exercises     Assessment/Plan    PT Assessment Patient needs continued PT services(while in hospital)  PT Problem List Decreased strength;Decreased activity tolerance;Decreased mobility;Decreased safety awareness;Decreased balance;Decreased coordination       PT Treatment Interventions Gait training;Functional mobility training;Therapeutic exercise;Neuromuscular re-education;Patient/family education;Balance training;Therapeutic activities    PT Goals (Current goals can be found in the Care Plan section)  Acute Rehab PT Goals Patient Stated Goal: go home Time For Goal Achievement: 04/07/19 Potential to Achieve Goals: Good    Frequency Min 3X/week   Barriers to discharge        Co-evaluation               AM-PAC PT "6 Clicks" Mobility  Outcome Measure Help needed turning from your back to your side while in a flat bed without using bedrails?: None Help needed moving from lying on your back to sitting on the side of a flat bed without using bedrails?: None Help needed moving to and from a bed to a chair (including a wheelchair)?: None Help needed standing up from a chair using your arms (e.g., wheelchair or bedside chair)?: None Help needed to walk in hospital room?: A Little Help needed climbing 3-5 steps with a railing? : A Lot 6 Click Score: 21    End of Session Equipment Utilized During Treatment: Gait belt Activity Tolerance: Treatment limited secondary to medical complications (Comment) Patient left: in bed;with call bell/phone within reach   PT Visit Diagnosis: Other abnormalities of gait and mobility (R26.89);Muscle weakness (generalized) (M62.81)    Time: 1250-1320 PT Time  Calculation (min) (ACUTE ONLY): 30 min   Charges:   PT Evaluation $PT Eval Moderate Complexity: 1 Mod PT Treatments $Therapeutic Activity: 8-22 mins        Horald Chestnut, PT   Delford Field 03/24/2019, 2:03 PM

## 2019-03-24 NOTE — Discharge Instructions (Signed)

## 2019-03-25 NOTE — Progress Notes (Signed)
Virtual Visit via Telephone Note  I connected with Amber Lloyd on 03/26/19 at  1:30 PM EDT by telephone and verified that I am speaking with the correct person using two identifiers.  Location: Patient: Home Provider: Office [facility] - [address] [place], [address], [place], [place] [address]   I discussed the limitations, risks, security and privacy concerns of performing an evaluation and management service by telephone and the availability of in person appointments. I also discussed with the patient that there may be a patient responsible charge related to this service. The patient expressed understanding and agreed to proceed.  Patient consented to consult via telephone: Yes People present and their role in pt care: Pt   History of Present Illness:  63 year old female followed in our office for known IPF and pulmonary emphysema.   Diagnosed positive for SARS-CoV-2 on 03/19/2019.  Hospitalized from 03/19/2019-03/24/2019 at [facility], managed by hospitalist team there.  Received Decadron and remdesivir.  Did not require mechanical ventilation.  Past medical history: Anxiety, anemia, alpha-1 antitrypsin deficiency carrier Smoking history: Former smoker.  Quit 2009.  34-pack-year smoking history Maintenance: Ofev, Stiolto Respimat Patient of Dr. Isaiah Serge  Chief complaint: 1 week follow-up, hospital follow-up   63 year old female followed in our office for IPF.  Patient recently diagnosed with SARS-CoV-2 on 03/19/2019.  At recommendations of Dr. Isaiah Serge patient presented to [facility] for emergent evaluation and treatment.  Patient did receive steroids as well as remdesivir.  She never needed mechanical ventilation.  She was managed by the hospitalist team.  Patient was never transferred to [facility].  Pt is doing well.  Patient is using her incentive spirometer and averaging around 1250.  She has been using the same out as she did in the hospital.  She is continued to be on  Ofev through this entire course she continues to use Stiolto Respimat.  Vital signs are stable today they are listed below   Observations/Objective:  03/26/2019 - Spo2 - 97 (4L o2) 03/26/2019 - BP  -111/66 03/26/2019 - HR - 78  03/19/2019-SARS-CoV-2-positive  03/19/2019-chest x-ray-chronic interstitial lung disease, vague bibasilar opacities may be related to interstitial lung disease or known COVID-19 pneumonia  11/19/2017-CT chest high-res-slight progression of disease compared to prior study, CT findings are now considered a typical UIP, diffuse bronchial wall thickening with moderate centrilobular mild paraseptal emphysema, mildly dilated pulmonary trunk  07/23/2018-spirometry with DLCO -FVC 2.02 (63% predicted), ratio 81, FEV1 1.64 (67% predicted, DLCO 5.75 (28% predicted)  Assessment and Plan:  Chronic respiratory failure with hypoxia (HCC) Plan:  Continue 4L of oxygen at this time   IPF (idiopathic pulmonary fibrosis) (HCC) Plan:  Continue OFEV  Will coordinate with OFEV nurse educator to call you about nutrition assistance   Pneumonia due to COVID-19 virus Plan:  Continue to monitor oxygen levels  Continue to monitor temperatures If symptoms worsen or shortness of breath occurs please contact our office  Pulmonary emphysema (HCC) Plan: Continue Stiolto Respimat   Protein calorie malnutrition (HCC) Plan: Worsened protein calorie malnutrition, continue supplement with protein Can use Ensure or boost in between meals Can use prostat as recommended at discharge from the hospital We will contact nurse educator from O[facility] to see if they have additional resources  Follow Up Instructions:  Return in about 4 weeks (around 04/23/2019), or if symptoms worsen or fail to improve, for Follow up with Dr. Isaiah Serge - video or tele visit .   I discussed the assessment and treatment plan with  the patient. The patient was provided an opportunity to ask questions and all were  answered. The patient agreed with the plan and demonstrated an understanding of the instructions.   The patient was advised to call back or seek an in-person evaluation if the symptoms worsen or if the condition fails to improve as anticipated.  I provided 25 minutes of non-face-to-face time during this encounter.   Lauraine Rinne, NP

## 2019-03-26 ENCOUNTER — Other Ambulatory Visit: Payer: Self-pay

## 2019-03-26 ENCOUNTER — Ambulatory Visit (INDEPENDENT_AMBULATORY_CARE_PROVIDER_SITE_OTHER): Admitting: Pulmonary Disease

## 2019-03-26 ENCOUNTER — Encounter: Payer: Self-pay | Admitting: Pulmonary Disease

## 2019-03-26 DIAGNOSIS — J1282 Pneumonia due to coronavirus disease 2019: Secondary | ICD-10-CM

## 2019-03-26 DIAGNOSIS — E46 Unspecified protein-calorie malnutrition: Secondary | ICD-10-CM | POA: Insufficient documentation

## 2019-03-26 DIAGNOSIS — U071 COVID-19: Secondary | ICD-10-CM

## 2019-03-26 DIAGNOSIS — J84112 Idiopathic pulmonary fibrosis: Secondary | ICD-10-CM

## 2019-03-26 DIAGNOSIS — J439 Emphysema, unspecified: Secondary | ICD-10-CM

## 2019-03-26 DIAGNOSIS — J1289 Other viral pneumonia: Secondary | ICD-10-CM

## 2019-03-26 DIAGNOSIS — J9611 Chronic respiratory failure with hypoxia: Secondary | ICD-10-CM | POA: Insufficient documentation

## 2019-03-26 DIAGNOSIS — E440 Moderate protein-calorie malnutrition: Secondary | ICD-10-CM

## 2019-03-26 NOTE — Assessment & Plan Note (Signed)
Plan:  Continue 4L of oxygen at this time

## 2019-03-26 NOTE — Assessment & Plan Note (Signed)
Plan:  Continue OFEV  Will coordinate with [place] nurse educator to call you about nutrition assistance

## 2019-03-26 NOTE — Assessment & Plan Note (Addendum)
Plan:  Continue to monitor oxygen levels  Continue to monitor temperatures If symptoms worsen or shortness of breath occurs please contact our office

## 2019-03-26 NOTE — Patient Instructions (Addendum)
You were seen today by Amber Ceo, NP  for:   1. Pneumonia due to COVID-19 virus  Continue to monitor temperatures as well as symptoms  Continue I/S   Get help right away if:  You have trouble breathing.  You have pain or pressure in your chest.  You have confusion.  You have bluish lips and fingernails.  You have difficulty waking from sleep.  You have symptoms that get worse.  These symptoms may represent a serious problem that is an emergency. Do not wait to see if the symptoms will go away. Get medical help right away. Call your local emergency services (911 in the [place] Do not drive yourself to the hospital. Let the emergency medical personnel know if you think you have COVID-19.  2. IPF (idiopathic pulmonary fibrosis) (HCC)  Continue OFEV   OFEV Nurse Educator:  Verita Lamb Patient clinical nurse educator OFEV [phone]   3. Pulmonary emphysema, unspecified emphysema type (HCC)  Stiolto Respimat inhaler >>>2 puffs daily >>>Take this no matter what >>>This is not a rescue inhaler  Note your daily symptoms > remember "red flags" for COPD:   >>>Increase in cough >>>increase in sputum production >>>increase in shortness of breath or activity  intolerance.   If you notice these symptoms, please call the office to be seen.   4. Chronic respiratory failure with hypoxia (HCC)  Continue oxygen therapy as prescribed  >>>maintain oxygen saturations greater than 88 percent  >>>if unable to maintain oxygen saturations please contact the office  >>>do not smoke with oxygen  >>>can use nasal saline gel or nasal saline rinses to moisturize nose if oxygen causes dryness   Follow Up:    Return in about 4 weeks (around 04/23/2019), or if symptoms worsen or fail to improve, for Follow up with Dr. Isaiah Serge - video or tele visit .   Please do your part to reduce the spread of COVID-19:      Reduce your risk of any infection  and COVID19 by using the similar  precautions used for avoiding the common cold or flu:  Marland Kitchen Wash your hands often with soap and warm water for at least 20 seconds.  If soap and water are not readily available, use an alcohol-based hand sanitizer with at least 60% alcohol.  . If coughing or sneezing, cover your mouth and nose by coughing or sneezing into the elbow areas of your shirt or coat, into a tissue or into your sleeve (not your hands). Drinda Butts A MASK when in public  . Avoid shaking hands with others and consider head nods or verbal greetings only. . Avoid touching your eyes, nose, or mouth with unwashed hands.  . Avoid close contact with people who are sick. . Avoid places or events with large numbers of people in one location, like concerts or sporting events. . If you have some symptoms but not all symptoms, continue to monitor at home and seek medical attention if your symptoms worsen. . If you are having a medical emergency, call 911.   ADDITIONAL HEALTHCARE OPTIONS FOR PATIENTS  [facility] / e-Visit: [email or web address]         [facility]: [phone]  [facility]: [phone]                   [facility]: [phone]     It is flu season:   >>> Best ways to protect herself from the flu: Receive the yearly flu  vaccine, practice good hand hygiene washing with soap and also using hand sanitizer when available, eat a nutritious meals, get adequate rest, hydrate appropriately   Please contact the office if your symptoms worsen or you have concerns that you are not improving.   Thank you for choosing [facility] for your healthcare, and for allowing [place] to partner with you on your healthcare journey. I am thankful to be able to provide care to you today.   Amber Quaker FNP-C     This information is directly available on the CDC website:  RunningShows.co.za.html    Source:CDC Reference to specific commercial products, manufacturers, companies, or trademarks does not constitute its endorsement or recommendation by the [place], [place], or Centers for [employer] and Prevention.

## 2019-03-26 NOTE — Assessment & Plan Note (Signed)
Plan: Continue Stiolto Respimat  

## 2019-03-26 NOTE — Assessment & Plan Note (Signed)
Plan: Worsened protein calorie malnutrition, continue supplement with protein Can use Ensure or boost in between meals Can use prostat as recommended at discharge from the hospital We will contact nurse educator from [facility] to see if they have additional resources

## 2019-03-27 ENCOUNTER — Encounter (INDEPENDENT_AMBULATORY_CARE_PROVIDER_SITE_OTHER): Payer: Self-pay

## 2019-03-29 ENCOUNTER — Encounter (INDEPENDENT_AMBULATORY_CARE_PROVIDER_SITE_OTHER): Payer: Self-pay

## 2019-03-31 ENCOUNTER — Encounter (INDEPENDENT_AMBULATORY_CARE_PROVIDER_SITE_OTHER): Payer: Self-pay

## 2019-04-02 ENCOUNTER — Encounter (INDEPENDENT_AMBULATORY_CARE_PROVIDER_SITE_OTHER): Payer: Self-pay

## 2019-04-05 ENCOUNTER — Encounter (INDEPENDENT_AMBULATORY_CARE_PROVIDER_SITE_OTHER): Payer: Self-pay

## 2019-04-12 ENCOUNTER — Other Ambulatory Visit: Payer: Self-pay

## 2019-04-12 ENCOUNTER — Encounter (HOSPITAL_COMMUNITY): Payer: Self-pay | Admitting: *Deleted

## 2019-04-12 ENCOUNTER — Emergency Department (HOSPITAL_COMMUNITY)
Admission: EM | Admit: 2019-04-12 | Discharge: 2019-04-12 | Disposition: A | Discharge status: Home / Self Care | Attending: Emergency Medicine | Admitting: Emergency Medicine

## 2019-04-12 ENCOUNTER — Emergency Department (HOSPITAL_COMMUNITY)

## 2019-04-12 DIAGNOSIS — R109 Unspecified abdominal pain: Secondary | ICD-10-CM | POA: Diagnosis present

## 2019-04-12 DIAGNOSIS — N200 Calculus of kidney: Secondary | ICD-10-CM | POA: Insufficient documentation

## 2019-04-12 DIAGNOSIS — Z79899 Other long term (current) drug therapy: Secondary | ICD-10-CM | POA: Diagnosis not present

## 2019-04-12 DIAGNOSIS — U071 COVID-19: Secondary | ICD-10-CM | POA: Insufficient documentation

## 2019-04-12 DIAGNOSIS — Z87891 Personal history of nicotine dependence: Secondary | ICD-10-CM | POA: Diagnosis not present

## 2019-04-12 HISTORY — DX: COVID-19: U07.1

## 2019-04-12 LAB — URINALYSIS, ROUTINE W REFLEX MICROSCOPIC
Bacteria, UA: NONE SEEN
Bilirubin Urine: NEGATIVE
Glucose, UA: NEGATIVE mg/dL
Ketones, ur: NEGATIVE mg/dL
Nitrite: NEGATIVE
Protein, ur: NEGATIVE mg/dL
Specific Gravity, Urine: >1.046 — ABNORMAL HIGH (ref 1.005–1.030)
pH: 5 (ref 5.0–8.0)

## 2019-04-12 LAB — BASIC METABOLIC PANEL
Anion gap: 9 (ref 5–15)
BUN: 23 mg/dL (ref 8–23)
CO2: 28 mmol/L (ref 22–32)
Calcium: 9.5 mg/dL (ref 8.9–10.3)
Chloride: 104 mmol/L (ref 98–111)
Creatinine, Ser: 0.79 mg/dL (ref 0.44–1.00)
GFR calc Af Amer: >60 mL/min (ref >60)
GFR calc non Af Amer: >60 mL/min (ref >60)
Glucose, Bld: 134 mg/dL — ABNORMAL HIGH (ref 70–99)
Potassium: 3 mmol/L — ABNORMAL LOW (ref 3.5–5.1)
Sodium: 141 mmol/L (ref 135–145)

## 2019-04-12 LAB — CBC
HCT: 43.1 % (ref 36.0–46.0)
Hemoglobin: 14.1 g/dL (ref 12.0–15.0)
MCH: 33.4 pg (ref 26.0–34.0)
MCHC: 32.7 g/dL (ref 30.0–36.0)
MCV: 102.1 fL — ABNORMAL HIGH (ref 80.0–100.0)
Platelets: 151 10*3/uL (ref 150–400)
RBC: 4.22 MIL/uL (ref 3.87–5.11)
RDW: 13.1 % (ref 11.5–15.5)
WBC: 9.1 10*3/uL (ref 4.0–10.5)
nRBC: 0 % (ref 0.0–0.2)

## 2019-04-12 LAB — HEPATIC FUNCTION PANEL
ALT: 24 U/L (ref 0–44)
AST: 23 U/L (ref 15–41)
Albumin: 3.9 g/dL (ref 3.5–5.0)
Alkaline Phosphatase: 58 U/L (ref 38–126)
Bilirubin, Direct: 0.2 mg/dL (ref 0.0–0.2)
Indirect Bilirubin: 0.6 mg/dL (ref 0.3–0.9)
Total Bilirubin: 0.8 mg/dL (ref 0.3–1.2)
Total Protein: 7.2 g/dL (ref 6.5–8.1)

## 2019-04-12 LAB — D-DIMER, QUANTITATIVE: D-Dimer, Quant: 0.4 ug/mL-FEU (ref 0.00–0.50)

## 2019-04-12 LAB — LIPASE, BLOOD: Lipase: 20 U/L (ref 11–51)

## 2019-04-12 MED ORDER — ONDANSETRON 4 MG PO TBDP: ORAL_TABLET | ORAL | 0 refills | Status: DC

## 2019-04-12 MED ORDER — SODIUM CHLORIDE 0.9 % IV BOLUS
1000.0000 mL | Freq: Once | INTRAVENOUS | Status: AC
  Administered 2019-04-12: 1000 mL via INTRAVENOUS

## 2019-04-12 MED ORDER — HYDROCODONE-ACETAMINOPHEN 5-325 MG PO TABS: 1.0000 | ORAL_TABLET | Freq: Four times a day (QID) | ORAL | 0 refills | Status: DC | PRN

## 2019-04-12 MED ORDER — HYDROCODONE-ACETAMINOPHEN 5-325 MG PO TABS: 1.0000 | ORAL_TABLET | Freq: Four times a day (QID) | ORAL | 0 refills | Status: AC | PRN

## 2019-04-12 MED ORDER — CEPHALEXIN 500 MG PO CAPS: 500.0000 mg | ORAL_CAPSULE | Freq: Four times a day (QID) | ORAL | 0 refills | Status: DC

## 2019-04-12 MED ORDER — IOHEXOL 300 MG/ML  SOLN
100.0000 mL | Freq: Once | INTRAMUSCULAR | Status: AC | PRN
  Administered 2019-04-12: 100 mL via INTRAVENOUS

## 2019-04-12 MED ORDER — PROMETHAZINE HCL 25 MG/ML IJ SOLN
12.5000 mg | Freq: Once | INTRAMUSCULAR | Status: AC
  Administered 2019-04-12: 12.5 mg via INTRAVENOUS
  Filled 2019-04-12: qty 1

## 2019-04-12 MED ORDER — HYDROMORPHONE HCL 1 MG/ML IJ SOLN
1.0000 mg | Freq: Once | INTRAMUSCULAR | Status: AC
  Administered 2019-04-12: 1 mg via INTRAVENOUS
  Filled 2019-04-12: qty 1

## 2019-04-12 MED ORDER — SODIUM CHLORIDE 0.9 % IV SOLN
1.0000 g | Freq: Once | INTRAVENOUS | Status: AC
  Administered 2019-04-12: 1 g via INTRAVENOUS
  Filled 2019-04-12: qty 10

## 2019-04-12 MED ORDER — POTASSIUM CHLORIDE CRYS ER 20 MEQ PO TBCR
40.0000 meq | EXTENDED_RELEASE_TABLET | Freq: Once | ORAL | Status: AC
  Administered 2019-04-12: 40 meq via ORAL
  Filled 2019-04-12: qty 2

## 2019-04-12 MED ORDER — ONDANSETRON HCL 4 MG/2ML IJ SOLN
4.0000 mg | Freq: Once | INTRAMUSCULAR | Status: AC
  Administered 2019-04-12: 4 mg via INTRAVENOUS
  Filled 2019-04-12: qty 2

## 2019-04-12 NOTE — ED Triage Notes (Signed)
Pt with right flank pain that starts in back and radiates around to the front.  Denies fever.  + nausea and emesis +diarrhea since last night.

## 2019-04-12 NOTE — ED Notes (Signed)
Pt stated that medications were sent to incorrect pharmacy. Changed in computer

## 2019-04-12 NOTE — Discharge Instructions (Addendum)
Call [facility] tomorrow and set up an appointment to be seen this week.  Drink plenty of fluids

## 2019-04-13 ENCOUNTER — Ambulatory Visit (HOSPITAL_COMMUNITY)
Admission: AD | Admit: 2019-04-13 | Discharge: 2019-04-13 | Disposition: A | Discharge status: Home / Self Care | Source: Ambulatory Visit | Attending: Urology | Admitting: Urology

## 2019-04-13 ENCOUNTER — Other Ambulatory Visit: Payer: Self-pay | Admitting: Urology

## 2019-04-13 ENCOUNTER — Ambulatory Visit (HOSPITAL_COMMUNITY): Admitting: Certified Registered Nurse Anesthetist

## 2019-04-13 ENCOUNTER — Other Ambulatory Visit: Payer: Self-pay

## 2019-04-13 ENCOUNTER — Encounter (HOSPITAL_COMMUNITY): Payer: Self-pay

## 2019-04-13 ENCOUNTER — Encounter (HOSPITAL_COMMUNITY)
Admission: AD | Disposition: A | Discharge status: Home / Self Care | Payer: Self-pay | Source: Ambulatory Visit | Attending: Urology

## 2019-04-13 ENCOUNTER — Ambulatory Visit (HOSPITAL_COMMUNITY)

## 2019-04-13 DIAGNOSIS — J8410 Pulmonary fibrosis, unspecified: Secondary | ICD-10-CM | POA: Diagnosis not present

## 2019-04-13 DIAGNOSIS — Z87891 Personal history of nicotine dependence: Secondary | ICD-10-CM | POA: Diagnosis not present

## 2019-04-13 DIAGNOSIS — Z79899 Other long term (current) drug therapy: Secondary | ICD-10-CM | POA: Insufficient documentation

## 2019-04-13 DIAGNOSIS — Z87442 Personal history of urinary calculi: Secondary | ICD-10-CM | POA: Diagnosis not present

## 2019-04-13 DIAGNOSIS — F419 Anxiety disorder, unspecified: Secondary | ICD-10-CM | POA: Diagnosis not present

## 2019-04-13 DIAGNOSIS — N132 Hydronephrosis with renal and ureteral calculous obstruction: Secondary | ICD-10-CM | POA: Insufficient documentation

## 2019-04-13 DIAGNOSIS — J449 Chronic obstructive pulmonary disease, unspecified: Secondary | ICD-10-CM | POA: Insufficient documentation

## 2019-04-13 DIAGNOSIS — Z9981 Dependence on supplemental oxygen: Secondary | ICD-10-CM | POA: Diagnosis not present

## 2019-04-13 HISTORY — PX: CYSTOSCOPY W/ URETERAL STENT PLACEMENT: SHX1429

## 2019-04-13 LAB — SARS CORONAVIRUS 2 (TAT 6-24 HRS): SARS Coronavirus 2: POSITIVE — AB

## 2019-04-13 SURGERY — CYSTOSCOPY, WITH RETROGRADE PYELOGRAM AND URETERAL STENT INSERTION
Anesthesia: General | Site: Ureter | Laterality: Right

## 2019-04-13 MED ORDER — PROPOFOL 10 MG/ML IV BOLUS
INTRAVENOUS | Status: AC
  Filled 2019-04-13: qty 20

## 2019-04-13 MED ORDER — ONDANSETRON HCL 4 MG/2ML IJ SOLN
INTRAMUSCULAR | Status: AC
  Filled 2019-04-13: qty 2

## 2019-04-13 MED ORDER — CIPROFLOXACIN IN D5W 400 MG/200ML IV SOLN
400.0000 mg | INTRAVENOUS | Status: AC
  Administered 2019-04-13: 400 mg via INTRAVENOUS
  Filled 2019-04-13: qty 200

## 2019-04-13 MED ORDER — TRAMADOL HCL 50 MG PO TABS: 50.0000 mg | ORAL_TABLET | Freq: Four times a day (QID) | ORAL | 0 refills | Status: AC | PRN

## 2019-04-13 MED ORDER — IOHEXOL 300 MG/ML  SOLN
INTRAMUSCULAR | Status: DC | PRN
  Administered 2019-04-13: 8 mL

## 2019-04-13 MED ORDER — FENTANYL CITRATE (PF) 100 MCG/2ML IJ SOLN
INTRAMUSCULAR | Status: AC
  Filled 2019-04-13: qty 2

## 2019-04-13 MED ORDER — DEXAMETHASONE SODIUM PHOSPHATE 10 MG/ML IJ SOLN
INTRAMUSCULAR | Status: DC | PRN
  Administered 2019-04-13: 4 mg via INTRAVENOUS

## 2019-04-13 MED ORDER — DEXAMETHASONE SODIUM PHOSPHATE 10 MG/ML IJ SOLN
INTRAMUSCULAR | Status: AC
  Filled 2019-04-13: qty 1

## 2019-04-13 MED ORDER — SODIUM CHLORIDE 0.9 % IR SOLN
Status: DC | PRN
  Administered 2019-04-13: 3000 mL via INTRAVESICAL

## 2019-04-13 MED ORDER — DEXAMETHASONE SODIUM PHOSPHATE 10 MG/ML IJ SOLN
INTRAMUSCULAR | Status: AC
  Filled 2019-04-13: qty 2

## 2019-04-13 MED ORDER — ONDANSETRON HCL 4 MG PO TABS: 4.0000 mg | ORAL_TABLET | Freq: Every day | ORAL | 1 refills | Status: AC | PRN

## 2019-04-13 MED ORDER — PHENAZOPYRIDINE HCL 200 MG PO TABS: 200.0000 mg | ORAL_TABLET | Freq: Three times a day (TID) | ORAL | 0 refills | Status: AC | PRN

## 2019-04-13 MED ORDER — KETOROLAC TROMETHAMINE 30 MG/ML IJ SOLN: 30.0000 mg | Freq: Once | INTRAMUSCULAR | Status: DC | PRN

## 2019-04-13 MED ORDER — LACTATED RINGERS IV SOLN
INTRAVENOUS | Status: DC
  Administered 2019-04-13: 16:00:00 via INTRAVENOUS

## 2019-04-13 MED ORDER — PHENYLEPHRINE 40 MCG/ML (10ML) SYRINGE FOR IV PUSH (FOR BLOOD PRESSURE SUPPORT)
PREFILLED_SYRINGE | INTRAVENOUS | Status: AC
  Filled 2019-04-13: qty 20

## 2019-04-13 MED ORDER — ONDANSETRON HCL 4 MG/2ML IJ SOLN
INTRAMUSCULAR | Status: AC
  Filled 2019-04-13: qty 4

## 2019-04-13 MED ORDER — FENTANYL CITRATE (PF) 100 MCG/2ML IJ SOLN: 25.0000 ug | INTRAMUSCULAR | Status: DC | PRN

## 2019-04-13 MED ORDER — PROPOFOL 10 MG/ML IV BOLUS
INTRAVENOUS | Status: DC | PRN
  Administered 2019-04-13: 80 mg via INTRAVENOUS

## 2019-04-13 MED ORDER — EPHEDRINE 5 MG/ML INJ
INTRAVENOUS | Status: AC
  Filled 2019-04-13: qty 20

## 2019-04-13 MED ORDER — ONDANSETRON HCL 4 MG/2ML IJ SOLN
INTRAMUSCULAR | Status: DC | PRN
  Administered 2019-04-13: 4 mg via INTRAVENOUS

## 2019-04-13 MED ORDER — LIDOCAINE 2% (20 MG/ML) 5 ML SYRINGE
INTRAMUSCULAR | Status: AC
  Filled 2019-04-13: qty 15

## 2019-04-13 MED ORDER — PROMETHAZINE HCL 25 MG/ML IJ SOLN: 6.2500 mg | INTRAMUSCULAR | Status: DC | PRN

## 2019-04-13 MED ORDER — FENTANYL CITRATE (PF) 100 MCG/2ML IJ SOLN
INTRAMUSCULAR | Status: DC | PRN
  Administered 2019-04-13: 50 ug via INTRAVENOUS

## 2019-04-13 MED ORDER — PHENYLEPHRINE 40 MCG/ML (10ML) SYRINGE FOR IV PUSH (FOR BLOOD PRESSURE SUPPORT)
PREFILLED_SYRINGE | INTRAVENOUS | Status: DC | PRN
  Administered 2019-04-13 (×2): 120 ug via INTRAVENOUS
  Administered 2019-04-13: 160 ug via INTRAVENOUS
  Administered 2019-04-13: 120 ug via INTRAVENOUS

## 2019-04-13 MED ORDER — LIDOCAINE 2% (20 MG/ML) 5 ML SYRINGE
INTRAMUSCULAR | Status: DC | PRN
  Administered 2019-04-13: 60 mg via INTRAVENOUS

## 2019-04-13 MED ORDER — LIDOCAINE 2% (20 MG/ML) 5 ML SYRINGE
INTRAMUSCULAR | Status: AC
  Filled 2019-04-13: qty 5

## 2019-04-13 MED ORDER — CEPHALEXIN 500 MG PO CAPS: 500.0000 mg | ORAL_CAPSULE | Freq: Two times a day (BID) | ORAL | 0 refills | Status: AC

## 2019-04-13 MED ORDER — SUCCINYLCHOLINE CHLORIDE 200 MG/10ML IV SOSY
PREFILLED_SYRINGE | INTRAVENOUS | Status: AC
  Filled 2019-04-13: qty 10

## 2019-04-13 MED ORDER — EPHEDRINE SULFATE-NACL 50-0.9 MG/10ML-% IV SOSY
PREFILLED_SYRINGE | INTRAVENOUS | Status: DC | PRN
  Administered 2019-04-13: 10 mg via INTRAVENOUS

## 2019-04-13 SURGICAL SUPPLY — 14 items
BAG URO CATCHER STRL LF (MISCELLANEOUS) ×3 IMPLANT
BASKET ZERO TIP NITINOL 2.4FR (BASKET) ×3 IMPLANT
CATH URET 5FR 28IN OPEN ENDED (CATHETERS) ×3 IMPLANT
CLOTH BEACON ORANGE TIMEOUT ST (SAFETY) ×3 IMPLANT
GLOVE BIOGEL M STRL SZ7.5 (GLOVE) ×3 IMPLANT
GOWN STRL REUS W/TWL XL LVL3 (GOWN DISPOSABLE) ×3 IMPLANT
GUIDEWIRE STR DUAL SENSOR (WIRE) IMPLANT
GUIDEWIRE ZIPWRE .038 STRAIGHT (WIRE) ×3 IMPLANT
KIT TURNOVER KIT A (KITS) IMPLANT
MANIFOLD NEPTUNE II (INSTRUMENTS) ×3 IMPLANT
PACK CYSTO (CUSTOM PROCEDURE TRAY) ×3 IMPLANT
STENT URET 6FRX24 CONTOUR (STENTS) ×3 IMPLANT
TUBING CONNECTING 10 (TUBING) ×2 IMPLANT
TUBING CONNECTING 10' (TUBING) ×1

## 2019-04-13 NOTE — Anesthesia Preprocedure Evaluation (Addendum)
Anesthesia Evaluation  Patient identified by MRN, date of birth, ID band Patient awake    Reviewed: Allergy & Precautions, NPO status , Patient's Chart, lab work & pertinent test results  Airway Mallampati: II  TM Distance: >3 FB Neck ROM: Full    Dental no notable dental hx.    Pulmonary COPD,  COPD inhaler, former smoker,    breath sounds clear to auscultation + decreased breath sounds      Cardiovascular negative cardio ROS Normal cardiovascular exam Rhythm:Regular Rate:Normal     Neuro/Psych negative neurological ROS  negative psych ROS   GI/Hepatic Neg liver ROS, GERD  ,  Endo/Other  negative endocrine ROS  Renal/GU negative Renal ROS  negative genitourinary   Musculoskeletal negative musculoskeletal ROS (+)   Abdominal   Peds negative pediatric ROS (+)  Hematology negative hematology ROS (+)   Anesthesia Other Findings   Reproductive/Obstetrics negative OB ROS                             Anesthesia Physical Anesthesia Plan  ASA: IV  Anesthesia Plan: General   Post-op Pain Management:    Induction: Intravenous  PONV Risk Score and Plan: 3 and Ondansetron, Dexamethasone and Treatment may vary due to age or medical condition  Airway Management Planned: LMA  Additional Equipment:   Intra-op Plan:   Post-operative Plan: Extubation in OR  Informed Consent: I have reviewed the patients History and Physical, chart, labs and discussed the procedure including the risks, benefits and alternatives for the proposed anesthesia with the patient or authorized representative who has indicated his/her understanding and acceptance.     Dental advisory given  Plan Discussed with: CRNA and Surgeon  Anesthesia Plan Comments:        Anesthesia Quick Evaluation

## 2019-04-13 NOTE — Anesthesia Procedure Notes (Signed)
Date/Time: 04/13/2019 7:01 PM Performed by: Cynda Familia, CRNA Oxygen Delivery Method: Simple face mask Placement Confirmation: positive ETCO2 and breath sounds checked- equal and bilateral Dental Injury: Teeth and Oropharynx as per pre-operative assessment

## 2019-04-13 NOTE — Op Note (Signed)
Operative Note  Preoperative diagnosis:  1.  8 mm right UVJ calculus  Postoperative diagnosis: 1.  Multiple 2 to 3 mm right UVJ calculi  Procedure(s): 1.  Cystoscopy with right ureteroscopy, basket stone extraction and right JJ stent placement 2.  Right retrograde pyelogram with intraoperative interpretation of fluoroscopic imaging  Surgeon: Ellison Hughs, MD  Assistants:  None  Anesthesia:  General  Complications:  None  EBL: Less than 5 mL  Specimens: 1.  Right ureteral stone  Drains/Catheters: 1.  Right 6 French, 24 cm JJ stent without tether  Intraoperative findings:   1. Right retrograde pyelogram revealed a uniformly dilated right ureter and right renal pelvis.  There was a filling defect within the distal aspects of the right ureter, consistent with the calcification seen on her recent CT.  No other filling defects were seen throughout the right ureter or within the confines of the right renal pelvis.  Indication:  Amber Lloyd is a 63 y.o. female with right-sided flank pain due to an obstructing right distal ureteral calculus.  She has been consented for the above procedures, voices understanding and wishes to proceed.  Description of procedure:  After informed consent was obtained, the patient was brought to the operating room and general LMA anesthesia was administered. The patient was then placed in the dorsolithotomy position and prepped and draped in the usual sterile fashion. A timeout was performed. A 23 French rigid cystoscope was then inserted into the urethral meatus and advanced into the bladder under direct vision. A complete bladder survey revealed no intravesical pathology.  A 5 French ureteral catheter was then inserted into the right ureteral orifice and a retrograde pyelogram was obtained, with the findings listed above.  A Glidewire was then used to intubate the lumen of the ureteral catheter and was advanced up to the right renal pelvis, under  fluoroscopic guidance.  The catheter was then removed, leaving the wire in place.  A semirigid ureteroscope was then advanced into the right ureter where I immediately identified a cluster of several 2 to 3 mm stones.  I used a 0 tip basket to extract all stone fragments from the lumen of the right ureter.  I advanced the ureteroscope all the way up to the UPJ and found no additional stone burden.  The semirigid ureteroscope was then removed, leaving the wire in place.  A 6 French, 24 cm JJ stent was then placed over the wire and into good position within the right collecting system, confirming placement via fluoroscopy.  The patient's bladder was drained.  She tolerated the procedure well and was transferred to the postanesthesia in stable condition.  Plan: Follow-up in 1 week for office cystoscopy and stent removal

## 2019-04-13 NOTE — Anesthesia Postprocedure Evaluation (Signed)
Anesthesia Post Note  Patient: Amber Lloyd  Procedure(Lloyd) Performed: CYSTOSCOPY RIGHT URETEROSCOPY; BASKET STONE EXTRACTION RIGHT URETERAL STENT PLACEMENT (Right Ureter)     Patient location during evaluation: PACU Anesthesia Type: General Level of consciousness: awake and alert Pain management: pain level controlled Vital Signs Assessment: post-procedure vital signs reviewed and stable Respiratory status: spontaneous breathing, nonlabored ventilation, respiratory function stable and patient connected to nasal cannula oxygen Cardiovascular status: blood pressure returned to baseline and stable Postop Assessment: no apparent nausea or vomiting Anesthetic complications: no    Last Vitals:  Vitals:   04/13/19 1543  BP: 99/63  Pulse: 82  Resp: 18  Temp: 37 C  SpO2: 98%    Last Pain:  Vitals:   04/13/19 1559  TempSrc:   PainSc: 2                  Amber Lloyd

## 2019-04-13 NOTE — Transfer of Care (Signed)
Immediate Anesthesia Transfer of Care Note  Patient: Amber Lloyd  Procedure(s) Performed: CYSTOSCOPY RIGHT URETEROSCOPY; BASKET STONE EXTRACTION RIGHT URETERAL STENT PLACEMENT (Right Ureter)  Patient Location: PACU  Anesthesia Type:General  Level of Consciousness: awake and alert   Airway & Oxygen Therapy: Patient Spontanous Breathing and Patient connected to face mask oxygen  Post-op Assessment: Report given to RN and Post -op Vital signs reviewed and stable  Post vital signs: Reviewed and stable  Last Vitals:  Vitals Value Taken Time  BP 94/66 04/13/19 1906  Temp    Pulse 92 04/13/19 1908  Resp 16 04/13/19 1908  SpO2 100 % 04/13/19 1908  Vitals shown include unvalidated device data.  Last Pain:  Vitals:   04/13/19 1559  TempSrc:   PainSc: 2       Patients Stated Pain Goal: 3 (25/49/82 6415)  Complications: No apparent anesthesia complications

## 2019-04-13 NOTE — H&P (Signed)
PRE-OP H&P  Office Visit Report     04/13/2019   --------------------------------------------------------------------------------   Amber Lloyd  MRN: [id number]  DOB: [birth year], 63 year old Female  SSN:    PRIMARY CARE:  Lenise Herald, PA-C  REFERRING:    PROVIDER:  Rhoderick Moody, M.D.  LOCATION:  [facility] [phone]     --------------------------------------------------------------------------------   CC: I have pain in the flank.  HPI: Amber Lloyd is a 63 year-old female patient who is here for flank pain.  The problem is on the right side. Her pain started about 04/11/2019. The pain is dull. The intensity of her pain is rated as a 9. The pain is intermittent. The pain does not radiate.   None< makes the pain better. Walking, standing, lifting, and twisting makes the pain worse. She was treated with the following pain medication(s): Vicodin.   She has had this same pain previously. She has had kidney stones.   -CTSS on 04/12/19 shows an 8 mm right UVJ stone along with a punctate distal ureteral stone associated with hydronephrosis  -BMP and WBC from 04/12/19 were WNL  -She is accompanied by her son today and he states that her bp normally runs in the 110s (systolic)  -Hx of COVID-19 and was discharged from [facility] in late October.     ALLERGIES: None   MEDICATIONS: Omeprazole 40 mg capsule,delayed release  Advil 200 mg capsule  Cephalexin 500 mg capsule  Clonazepam 1 mg tablet  Guaifenesin-Pseudoephedrine Er 600 mg-60 mg tablet, extended release 12 hr  Hydrocodone-Acetaminophen 5 mg-325 mg tablet  Iron  Ofev 150 mg capsule  Ondansetron Hcl 4 mg tablet  Oxygen  Sertraline Hcl 50 mg tablet  Tiotropium Bromide     GU PSH: None   NON-GU PSH: Hysterectomy     GU PMH: None     PMH Notes: COPD  Pulmonary fibrosis   NON-GU PMH: Abnormal results of pulmonary function studies Anxiety Arthritis GERD    FAMILY HISTORY: 1 Daughter  - No Family History 1 son - No Family History   SOCIAL HISTORY: Marital Status: Widowed Preferred Language: English; Race: White Current Smoking Status: Patient does not smoke anymore.   Tobacco Use Assessment Completed: Used Tobacco in last 30 days? Has never drank.  Drinks 2 caffeinated drinks per day.    REVIEW OF SYSTEMS:    GU Review Female:   Patient reports frequent urination, get up at night to urinate, and leakage of urine. Patient denies hard to postpone urination, burning /pain with urination, stream starts and stops, trouble starting your stream, have to strain to urinate, and being pregnant.  Gastrointestinal (Upper):   Patient reports nausea, vomiting, and indigestion/ heartburn.   Gastrointestinal (Lower):   Patient reports diarrhea and constipation.   Constitutional:   Patient reports fatigue. Patient denies fever, night sweats, and weight loss.  Skin:   Patient denies skin rash/ lesion and itching.  Eyes:   Patient denies blurred vision and double vision.  Ears/ Nose/ Throat:   Patient reports sore throat and sinus problems.   Hematologic/Lymphatic:   Patient reports easy bruising. Patient denies swollen glands.  Cardiovascular:   Patient denies leg swelling and chest pains.  Respiratory:   Patient reports shortness of breath. Patient denies cough.  Endocrine:   Patient denies excessive thirst.  Musculoskeletal:   Patient denies back pain and joint pain.  Neurological:   Patient reports headaches and dizziness.   Psychologic:   Patient reports anxiety. Patient denies  depression.   Notes: history of kidney stones     VITAL SIGNS:      04/13/2019 01:08 PM  Weight 180 lb / 81.65 kg  Height 64 in / 162.56 cm  BP 92/59 mmHg  Heart Rate 99 /min  Resp. Rate 97.8 /min  BMI 30.9 kg/m   MULTI-SYSTEM PHYSICAL EXAMINATION:    Constitutional: Well-nourished. No physical deformities. Normally developed. Good grooming.  Neck: Neck symmetrical, not swollen. Normal tracheal  position.  Respiratory: Labored breathing--on supplemental Oxygen at baseline. No use of accessory muscles.   Cardiovascular: Normal temperature, normal extremity pulses, no swelling, no varicosities.  Skin: No paleness, no jaundice, no cyanosis. No lesion, no ulcer, no rash.  Neurologic / Psychiatric: Oriented to time, oriented to place, oriented to person. No depression, no anxiety, no agitation.  Gastrointestinal: No mass, no tenderness, no rigidity, non obese abdomen.  Eyes: Normal conjunctivae. Normal eyelids.  Musculoskeletal: Normal gait and station of head and neck.     PAST DATA REVIEWED:  Source Of History:  Patient  Notes:                     CLINICAL DATA: Right-sided flank pain     EXAM:  CT ABDOMEN AND PELVIS WITH CONTRAST     TECHNIQUE:  Multidetector CT imaging of the abdomen and pelvis was performed  using the standard protocol following bolus administration of  intravenous contrast.     CONTRAST: OMNIPAQUE IOHEXOL 300 MG/ML SOLN     COMPARISON: CT chest 11/19/2017     FINDINGS:  Lower chest: Emphysematous disease at the lung bases. Peripheral and  basilar fibrosis consistent with interstitial lung disease. No acute  consolidation or effusion. Normal heart size. Large hiatal hernia  with intrathoracic stomach.     Hepatobiliary: No focal liver abnormality is seen. No gallstones,  gallbladder wall thickening, or biliary dilatation.     Pancreas: Unremarkable. No pancreatic ductal dilatation or  surrounding inflammatory changes.     Spleen: Normal in size without focal abnormality.     Adrenals/Urinary Tract: Adrenal glands are normal. Punctate stone in  the mid right kidney and lower left kidney. Subcentimeter  hypodensity right kidney midpole too small to further characterize.  Moderate hydronephrosis and hydroureter of the right kidney,  secondary to an 8 mm stone at or just past the right UVJ. Punctate  densities/suspected stones in the distal  right ureter just proximal  to the right UVJ. Delayed right nephrogram consistent with  obstruction.     Stomach/Bowel: Stomach is within normal limits. Sigmoid colon  diverticular disease without acute inflammatory process. No evidence  of bowel wall thickening, distention, or inflammatory changes.     Vascular/Lymphatic: Nonaneurysmal aorta. Mild aortic  atherosclerosis. No adenopathy     Reproductive: Status post hysterectomy. No adnexal masses.     Other: Negative for free air or free fluid     Musculoskeletal: No acute or significant osseous findings. Probable  chronic superior endplate deformity at L5     IMPRESSION:  1. Moderate right hydronephrosis and hydroureter, secondary to an 8  mm stone at or just past the right UVJ. There are additional  punctate stones in the distal right ureter just proximal to the  right UVJ.  2. Punctate intrarenal stones bilaterally.  3. Emphysematous disease and interstitial fibrosis at the lung  bases.  4. Sigmoid colon diverticular disease without acute inflammatory  change        Electronically Signed  By:  Donavan Foil M.D.  On: 04/12/2019 19:18   PROCEDURES:          Urinalysis w/Scope Dipstick Dipstick Cont'd Micro  Color: Yellow Bilirubin: Neg mg/dL WBC/hpf: 6 - 10/hpf  Appearance: Clear Ketones: Neg mg/dL RBC/hpf: 3 - 10/hpf  Specific Gravity: 1.015 Blood: 3+ ery/uL Bacteria: Rare (0-9/hpf)  pH: 6.0 Protein: Neg mg/dL Cystals: NS (Not Seen)  Glucose: Neg mg/dL Urobilinogen: 0.2 mg/dL Casts: NS (Not Seen)    Nitrites: Neg Trichomonas: Not Present    Leukocyte Esterase: 1+ leu/uL Mucous: Not Present      Epithelial Cells: 0 - 5/hpf      Yeast: NS (Not Seen)      Sperm: Not Present    ASSESSMENT:      ICD-10 Details  1 GU:   Ureteral calculus - N20.1 Right  2   Flank Pain - R10.84 Right   PLAN:           Orders Labs Urine Culture          Schedule Return Visit/Planned Activity: ASAP - Schedule Surgery           Document Letter(s):  Created for Patient: Clinical Summary         Notes:   The risks, benefits and alternatives of cystoscopy with RIGHT ureteroscopy, laser lithotripsy and ureteral stent placement was discussed the patient. Risks included, but are not limited to: bleeding, urinary tract infection, ureteral injury/avulsion, ureteral stricture formation, retained stone fragments, the possibility that multiple surgeries may be required to treat the stone(s), MI, stroke, PE and the inherent risks of general anesthesia. The patient voices understanding and wishes to proceed.

## 2019-04-13 NOTE — Anesthesia Procedure Notes (Signed)
Procedure Name: LMA Insertion Date/Time: 04/13/2019 6:32 PM Performed by: Cynda Familia, CRNA Pre-anesthesia Checklist: Patient identified, Emergency Drugs available, Suction available and Patient being monitored Patient Re-evaluated:Patient Re-evaluated prior to induction Oxygen Delivery Method: Circle System Utilized Preoxygenation: Pre-oxygenation with 100% oxygen Induction Type: IV induction Ventilation: Mask ventilation without difficulty LMA: LMA inserted LMA Size: 4.0 Number of attempts: 1 Placement Confirmation: positive ETCO2 Tube secured with: Tape Dental Injury: Teeth and Oropharynx as per pre-operative assessment  Comments: LMA by Susy Manor CRNA

## 2019-04-14 ENCOUNTER — Encounter (HOSPITAL_COMMUNITY): Payer: Self-pay | Admitting: Urology

## 2019-04-14 LAB — URINE CULTURE: Culture: <10000 — AB

## 2019-04-14 NOTE — ED Provider Notes (Signed)
[facility] EMERGENCY DEPARTMENT Provider Note   CSN: [id number] Arrival date & time: 04/12/19  1435     History   Chief Complaint Chief Complaint  Patient presents with  . Flank Pain    HPI Amber Lloyd is a 63 y.o. female.     Patient complains of right flank pain  The history is provided by the patient. No language interpreter was used.  Flank Pain This is a new problem. The current episode started 12 to 24 hours ago. The problem occurs constantly. The problem has not changed since onset.Pertinent negatives include no chest pain, no abdominal pain and no headaches. Nothing aggravates the symptoms. Nothing relieves the symptoms. She has tried nothing for the symptoms. The treatment provided no relief.    Past Medical History:  Diagnosis Date  . Anemia   . Anxiety   . COVID-19   . Depression   . GERD (gastroesophageal reflux disease)   . IBS (irritable bowel syndrome)   . ILD (interstitial lung disease) (HCC)   . Pulmonary fibrosis [facility]     Patient Active Problem List   Diagnosis Date Noted  . Chronic respiratory failure with hypoxia (HCC) 03/26/2019  . Protein calorie malnutrition (HCC) 03/26/2019  . Leukopenia 03/20/2019  . Pneumonia due to COVID-19 virus 03/19/2019  . Acute non-recurrent maxillary sinusitis 04/15/2018  . IPF (idiopathic pulmonary fibrosis) (HCC) 11/20/2017  . Alpha-1-antitrypsin deficiency carrier 09/24/2017  . Interstitial pulmonary disease (HCC) 09/24/2017  . Pulmonary emphysema (HCC) 02/28/2016  . Anemia 01/31/2016  . Anxiety 01/10/2016    Past Surgical History:  Procedure Laterality Date  . ABDOMINAL HYSTERECTOMY    . CYSTOSCOPY W/ URETERAL STENT PLACEMENT Right 04/13/2019   Procedure: CYSTOSCOPY RIGHT URETEROSCOPY; BASKET STONE EXTRACTION RIGHT URETERAL STENT PLACEMENT;  Surgeon: Rene Paci, MD;  Location: [facility];  Service: Urology;  Laterality: Right;  . PARTIAL HYSTERECTOMY Right 1983  . TUBAL LIGATION  1981      OB History   No obstetric history on file.      Home Medications    Prior to Admission medications   Medication Sig Start Date End Date Taking? Authorizing Provider  Amino Acids-Protein Hydrolys (FEEDING SUPPLEMENT, PRO-STAT SUGAR FREE 64,) LIQD Take 30 mLs by mouth 2 (two) times daily. 03/24/19  Yes Azucena Fallen, MD  calcium carbonate (TUMS CHEWY BITES) 750 MG chewable tablet Chew 1-2 tablets by mouth daily as needed for heartburn.   Yes [provider]  clonazePAM (KLONOPIN) 1 MG tablet TAKE 1 TABLET BY MOUTH AT BEDTIME Patient taking differently: Take 1 mg by mouth at bedtime.  10/11/18  Yes Mannam, Praveen, MD  dexamethasone (DECADRON) 2 MG tablet Take 6 mg by mouth daily. 03/19/19  Yes [provider]  ibuprofen (ADVIL) 200 MG tablet Take 200 mg by mouth daily as needed. For shoulder pain and headaches    Yes [provider]  IRON PO Take 1 tablet by mouth daily.   Yes [provider]  Multiple Vitamins-Minerals (MULTIVITAMIN WITH MINERALS) tablet Take 1 tablet by mouth daily.   Yes [provider]  Nintedanib (OFEV) 150 MG CAPS Take 150 mg by mouth 2 (two) times daily.   Yes [provider]  omeprazole (PRILOSEC) 40 MG capsule Take 1 capsule (40 mg total) by mouth daily. 07/07/18  Yes Mannam, Praveen, MD  PROVENTIL HFA 108 (90 Base) MCG/ACT inhaler INHALE 2 PUFFS BY MOUTH EVERY 6 HOURS AS NEEDED FOR COUGHING, WHEEZING, OR SHORTNESS OF BREATH Patient taking  differently: Inhale 1-2 puffs into the lungs every 6 (six) hours as needed for wheezing or shortness of breath.  12/17/17  Yes Jacquelin HawkingMcElroy, Shannon, PA-C  sertraline (ZOLOFT) 50 MG tablet Take 2 tablets (100 mg total) by mouth daily. 07/07/18  Yes Mannam, Praveen, MD  Tiotropium Bromide-Olodaterol (STIOLTO RESPIMAT) 2.5-2.5 MCG/ACT AERS Inhale 2 puffs into the lungs daily. 01/08/18  Yes Mannam, Praveen, MD  albuterol (PROVENTIL) (2.5 MG/3ML) 0.083% nebulizer solution INHALE  THE CONTENTS OF ONE VIAL VIA NEBULIZER EVERY 6 HOURS AS NEEDED FOR WHEEZING OR SHORTNESS OF BREATH Patient taking differently: Take 2.5 mg by nebulization every 6 (six) hours as needed for wheezing or shortness of breath.  09/04/18   Mannam, Colbert CoyerPraveen, MD  cephALEXin (KEFLEX) 500 MG capsule Take 1 capsule (500 mg total) by mouth 4 (four) times daily. 04/12/19   Bethann BerkshireZammit, Nassir Neidert, MD  cephALEXin (KEFLEX) 500 MG capsule Take 1 capsule (500 mg total) by mouth 2 (two) times daily for 3 days. 04/13/19 04/16/19  Rene PaciWinter, Christopher Aaron, MD  guaiFENesin (MUCINEX) 600 MG 12 hr tablet Take 600 mg by mouth 2 (two) times daily.    [provider]  guaiFENesin (ROBITUSSIN) 100 MG/5ML liquid Take 200 mg by mouth 3 (three) times daily as needed for cough.    [provider]  HYDROcodone-acetaminophen (NORCO/VICODIN) 5-325 MG tablet Take 1 tablet by mouth every 6 (six) hours as needed. 04/12/19   Bethann BerkshireZammit, Sakina Briones, MD  ondansetron (ZOFRAN ODT) 4 MG disintegrating tablet 4mg  ODT q4 hours prn nausea/vomit 04/12/19   Bethann BerkshireZammit, Karol Liendo, MD  ondansetron (ZOFRAN) 4 MG tablet Take 1 tablet (4 mg total) by mouth daily as needed for nausea or vomiting. 04/13/19 04/12/20  Rene PaciWinter, Christopher Aaron, MD  OXYGEN Inhale 4 L into the lungs continuous.     [provider]  phenazopyridine (PYRIDIUM) 200 MG tablet Take 1 tablet (200 mg total) by mouth 3 (three) times daily as needed (for pain with urination). 04/13/19 04/12/20  Rene PaciWinter, Christopher Aaron, MD  traMADol (ULTRAM) 50 MG tablet Take 1 tablet (50 mg total) by mouth every 6 (six) hours as needed for up to 3 days. 04/13/19 04/16/19  Rene PaciWinter, Christopher Aaron, MD    Family History Family History  Problem Relation Age of Onset  . Alzheimer's disease Mother   . Anemia Mother   . Anemia Sister   . Anemia Daughter   . Anemia Sister     Social History Social History   Tobacco Use  . Smoking status: Former Smoker    Packs/day: 1.00    Years: 34.00     Pack years: 34.00    Types: Cigarettes    Quit date: 07/27/2007    Years since quitting: 11.7  . Smokeless tobacco: Never Used  Substance Use Topics  . Alcohol use: No    Frequency: Never  . Drug use: No     Allergies   Patient has no known allergies.   Review of Systems Review of Systems  Constitutional: Negative for appetite change and fatigue.  HENT: Negative for congestion, ear discharge and sinus pressure.   Eyes: Negative for discharge.  Respiratory: Negative for cough.   Cardiovascular: Negative for chest pain.  Gastrointestinal: Negative for abdominal pain and diarrhea.  Genitourinary: Positive for flank pain. Negative for frequency and hematuria.  Musculoskeletal: Negative for back pain.  Skin: Negative for rash.  Neurological: Negative for seizures and headaches.  Psychiatric/Behavioral: Negative for hallucinations.     Physical Exam Updated Vital Signs BP 109/73  Pulse 86   Temp 97.7 F (36.5 C) (Oral)   Resp (!) 22   Ht 5\' 4"  (1.626 m)   Wt 81.6 kg   SpO2 95%   BMI 30.90 kg/m   Physical Exam Constitutional:      Appearance: She is well-developed.  HENT:     Head: Normocephalic.     Nose: Nose normal.     Mouth/Throat:     Mouth: Mucous membranes are moist.  Eyes:     General: No scleral icterus.    Conjunctiva/sclera: Conjunctivae normal.  Neck:     Musculoskeletal: Neck supple.     Thyroid: No thyromegaly.  Cardiovascular:     Rate and Rhythm: Normal rate and regular rhythm.     Heart sounds: No murmur. No friction rub. No gallop.   Pulmonary:     Breath sounds: No stridor. No wheezing or rales.  Chest:     Chest wall: No tenderness.  Abdominal:     General: There is no distension.     Tenderness: There is no abdominal tenderness. There is no rebound.  Musculoskeletal: Normal range of motion.     Comments: Tender right flank.  Lymphadenopathy:     Cervical: No cervical adenopathy.  Skin:    Findings: No erythema or rash.   Neurological:     Mental Status: She is alert and oriented to person, place, and time.     Motor: No abnormal muscle tone.     Coordination: Coordination normal.  Psychiatric:        Behavior: Behavior normal.      ED Treatments / Results  Labs (all labs ordered are listed, but only abnormal results are displayed) Labs Reviewed  URINE CULTURE - Abnormal; Notable for the following components:      Result Value   Culture   (*)    Value: <10,000 COLONIES/mL INSIGNIFICANT GROWTH Performed at [facility], [address] [place], [place] [address]    All other components within normal limits  SARS CORONAVIRUS 2 (TAT 6-24 HRS) - Abnormal; Notable for the following components:   SARS Coronavirus 2 POSITIVE (*)    All other components within normal limits  URINALYSIS, ROUTINE W REFLEX MICROSCOPIC - Abnormal; Notable for the following components:   Specific Gravity, Urine >1.046 (*)    Hgb urine dipstick SMALL (*)    Leukocytes,Ua TRACE (*)    All other components within normal limits  BASIC METABOLIC PANEL - Abnormal; Notable for the following components:   Potassium 3.0 (*)    Glucose, Bld 134 (*)    All other components within normal limits  CBC - Abnormal; Notable for the following components:   MCV 102.1 (*)    All other components within normal limits  HEPATIC FUNCTION PANEL  LIPASE, BLOOD  D-DIMER, QUANTITATIVE (NOT AT [employer])    EKG None  Radiology Ct Abdomen Pelvis W Contrast  Result Date: 04/12/2019 CLINICAL DATA:  Right-sided flank pain EXAM: CT ABDOMEN AND PELVIS WITH CONTRAST TECHNIQUE: Multidetector CT imaging of the abdomen and pelvis was performed using the standard protocol following bolus administration of intravenous contrast. CONTRAST:  140mL OMNIPAQUE IOHEXOL 300 MG/ML  SOLN COMPARISON:  CT chest 11/19/2017 FINDINGS: Lower chest: Emphysematous disease at the lung bases. Peripheral and basilar fibrosis consistent with interstitial lung disease. No  acute consolidation or effusion. Normal heart size. Large hiatal hernia with intrathoracic stomach. Hepatobiliary: No focal liver abnormality is seen. No gallstones, gallbladder wall thickening, or biliary dilatation. Pancreas:  Unremarkable. No pancreatic ductal dilatation or surrounding inflammatory changes. Spleen: Normal in size without focal abnormality. Adrenals/Urinary Tract: Adrenal glands are normal. Punctate stone in the mid right kidney and lower left kidney. Subcentimeter hypodensity right kidney midpole too small to further characterize. Moderate hydronephrosis and hydroureter of the right kidney, secondary to an 8 mm stone at or just past the right UVJ. Punctate densities/suspected stones in the distal right ureter just proximal to the right UVJ. Delayed right nephrogram consistent with obstruction. Stomach/Bowel: Stomach is within normal limits. Sigmoid colon diverticular disease without acute inflammatory process. No evidence of bowel wall thickening, distention, or inflammatory changes. Vascular/Lymphatic: Nonaneurysmal aorta. Mild aortic atherosclerosis. No adenopathy Reproductive: Status post hysterectomy. No adnexal masses. Other: Negative for free air or free fluid Musculoskeletal: No acute or significant osseous findings. Probable chronic superior endplate deformity at L5 IMPRESSION: 1. Moderate right hydronephrosis and hydroureter, secondary to an 8 mm stone at or just past the right UVJ. There are additional punctate stones in the distal right ureter just proximal to the right UVJ. 2. Punctate intrarenal stones bilaterally. 3. Emphysematous disease and interstitial fibrosis at the lung bases. 4. Sigmoid colon diverticular disease without acute inflammatory change Electronically Signed   By: Jasmine PangKim  Fujinaga M.D.   On: 04/12/2019 19:18   Dg Chest Portable 1 View  Result Date: 04/12/2019 CLINICAL DATA:  Right flank pain. EXAM: PORTABLE CHEST 1 VIEW COMPARISON:  03/19/2019. Chest CT dated  11/19/2017. FINDINGS: Stable enlarged cardiac silhouette. Diffuse increase in prominence of the pulmonary vasculature and interstitial markings. Probable small left pleural effusion and minimal right pleural effusion. Moderate-sized hiatal hernia. No acute bony abnormality. IMPRESSION: 1. Stable cardiomegaly with interval changes of acute congestive heart failure. 2. Moderate-sized hiatal hernia. 3. Probable small left pleural effusion and minimal right pleural effusion. Electronically Signed   By: Beckie SaltsSteven  Reid M.D.   On: 04/12/2019 17:11   Dg C-arm 1-60 Min-no Report  Result Date: 04/13/2019 Fluoroscopy was utilized by the requesting physician.  No radiographic interpretation.    Procedures Procedures (including critical care time)  Medications Ordered in ED Medications  HYDROmorphone (DILAUDID) injection 1 mg (1 mg Intravenous Given 04/12/19 1646)  ondansetron (ZOFRAN) injection 4 mg (4 mg Intravenous Given 04/12/19 1645)  iohexol (OMNIPAQUE) 300 MG/ML solution 100 mL (100 mLs Intravenous Contrast Given 04/12/19 1840)  HYDROmorphone (DILAUDID) injection 1 mg (1 mg Intravenous Given 04/12/19 1942)  ondansetron (ZOFRAN) injection 4 mg (4 mg Intravenous Given 04/12/19 1942)  potassium chloride SA (KLOR-CON) CR tablet 40 mEq (40 mEq Oral Given 04/12/19 2136)  sodium chloride 0.9 % bolus 1,000 mL (0 mLs Intravenous Stopped 04/12/19 2313)  promethazine (PHENERGAN) injection 12.5 mg (12.5 mg Intravenous Given 04/12/19 2137)  cefTRIAXone (ROCEPHIN) 1 g in sodium chloride 0.9 % 100 mL IVPB (0 g Intravenous Stopped 04/12/19 2313)     Initial Impression / Assessment and Plan / ED Course  I have reviewed the triage vital signs and the nursing notes.  Pertinent labs & imaging results that were available during my care of the patient were reviewed by me and considered in my medical decision making (see chart for details).        Patient with a large kidney stone on the right.  She is placed on  antibiotics pain medicine nausea medicine will follow-up with urology  Final Clinical Impressions(s) / ED Diagnoses   Final diagnoses:  Kidney stone    ED Discharge Orders         Ordered  cephALEXin (KEFLEX) 500 MG capsule  4 times daily,   Status:  Discontinued     04/12/19 2241    ondansetron (ZOFRAN ODT) 4 MG disintegrating tablet  Status:  Discontinued     04/12/19 2241    HYDROcodone-acetaminophen (NORCO/VICODIN) 5-325 MG tablet  Every 6 hours PRN,   Status:  Discontinued     04/12/19 2241    ondansetron (ZOFRAN ODT) 4 MG disintegrating tablet     04/12/19 2316    cephALEXin (KEFLEX) 500 MG capsule  4 times daily     04/12/19 2316    HYDROcodone-acetaminophen (NORCO/VICODIN) 5-325 MG tablet  Every 6 hours PRN     04/12/19 2316           Bethann Berkshire, MD 04/14/19 1645

## 2019-04-23 ENCOUNTER — Encounter (INDEPENDENT_AMBULATORY_CARE_PROVIDER_SITE_OTHER): Payer: Self-pay

## 2019-04-24 ENCOUNTER — Telehealth: Payer: Self-pay

## 2019-04-24 ENCOUNTER — Encounter (INDEPENDENT_AMBULATORY_CARE_PROVIDER_SITE_OTHER): Payer: Self-pay

## 2019-04-24 NOTE — Telephone Encounter (Signed)
Left a vm for patient to callback regarding mychart questionnaire  

## 2019-04-26 ENCOUNTER — Encounter (INDEPENDENT_AMBULATORY_CARE_PROVIDER_SITE_OTHER): Payer: Self-pay

## 2019-05-25 ENCOUNTER — Other Ambulatory Visit: Payer: Self-pay | Admitting: Urology

## 2019-06-09 ENCOUNTER — Telehealth: Payer: Self-pay | Admitting: Pulmonary Disease

## 2019-06-09 DIAGNOSIS — J84112 Idiopathic pulmonary fibrosis: Secondary | ICD-10-CM

## 2019-06-09 MED ORDER — STIOLTO RESPIMAT 2.5-2.5 MCG/ACT IN AERS: 2.0000 | INHALATION_SPRAY | Freq: Every day | RESPIRATORY_TRACT | 12 refills | Status: AC

## 2019-06-09 NOTE — Telephone Encounter (Signed)
I called the patient back and she stated that she was not sure if new would be required. Prescription last issued 01/08/18. But was needing prescription sent to [facility] directly. She was not sure if she another assistance form had to be completed.  Patient had televisit on 03/26/19 with Elisha Headland, NP (pneumonia due to covid) and a video visit with Dr. Isaiah Serge on 03/19/19 due to Covid.   Patient scheduled for video visit with Dr. Isaiah Serge on 06/22/19. Prescription printed for signature when Dr. Isaiah Serge returns to clinic 06/12/19.

## 2019-06-17 ENCOUNTER — Telehealth: Payer: Self-pay | Admitting: Pulmonary Disease

## 2019-06-17 NOTE — Telephone Encounter (Signed)
Checked and the prescription was printed on 06/09/19. Called the prescription into the pharmacy.  Called the patient and she confirmed it was to be sent to [facility].   Checked with Alcario Drought who confirmed the fax that was printed was signed by Dr. Isaiah Serge and faxed to [facility].  Called the pharmacy back to ask them to disregard the prescription that was called in.  Called back the patient to make her aware, she voiced understanding. Nothing further needed at this time.

## 2019-06-22 ENCOUNTER — Telehealth (INDEPENDENT_AMBULATORY_CARE_PROVIDER_SITE_OTHER): Payer: Medicare Other | Admitting: Pulmonary Disease

## 2019-06-22 ENCOUNTER — Encounter: Payer: Self-pay | Admitting: Pulmonary Disease

## 2019-06-22 ENCOUNTER — Telehealth: Payer: Self-pay | Admitting: Pulmonary Disease

## 2019-06-22 DIAGNOSIS — J84112 Idiopathic pulmonary fibrosis: Secondary | ICD-10-CM | POA: Diagnosis not present

## 2019-06-22 NOTE — Telephone Encounter (Signed)
Noted , will forward to [facility] and ER to follow up on

## 2019-06-22 NOTE — Patient Instructions (Signed)
Continue the Ofev and supplemental oxygen You can go up on the oxygen up to 5 L as needed if your O2 sats remained below 90% Follow-up in 3 months with televisit.

## 2019-06-22 NOTE — Progress Notes (Signed)
Virtual Visit via Video Note  I connected with Amber Lloyd on 06/22/19 at  2:00 PM EST by telephone and verified that I am speaking with the correct person using two identifiers.  Location: Patient: Home Provider: [facility], [address], [place], [place]   I discussed the limitations, risks, security and privacy concerns of performing an evaluation and management service by telephone and the availability of in person appointments. I also discussed with the patient that there may be a patient responsible charge related to this service. The patient expressed understanding and agreed to proceed.   History of Present Illness: 64 year old with IPF, pulmonary emphysema, alpha-1 antitrypsin deficiency carrier Maintained on Ofev Diagnosed with SARS-CoV-2 with hospitalization from 10/22-10/27.  Received Decadron, remdesivir   Observations/Objective: She is stable post hospitalization.  She has significant fatigue and dyspnea which preceded her COVID-19 diagnosis Continues on supplemental oxygen at 4 L with desats on exertion Continue on Ofev and Stiolto  Assessment and Plan: IPF Continue Ofev Continue supplemental oxygen.  Can go up to 5 L as needed for desats  Emphysema Continue Stiolto Respimat  Follow Up Instructions: 3 months.   I discussed the assessment and treatment plan with the patient. The patient was provided an opportunity to ask questions and all were answered. The patient agreed with the plan and demonstrated an understanding of the instructions.   The patient was advised to call back or seek an in-person evaluation if the symptoms worsen or if the condition fails to improve as anticipated.  I provided 25 minutes of non-face-to-face time during this encounter.   Chilton Greathouse MD [facility] 06/22/2019, 1:55 PM

## 2019-06-23 NOTE — Telephone Encounter (Signed)
Patient assistance forms have been faxed and patient made aware.

## 2019-06-23 NOTE — Telephone Encounter (Signed)
Paperwork received and will be placed with Dr. Isaiah Serge today to sign.

## 2019-06-30 DIAGNOSIS — J9611 Chronic respiratory failure with hypoxia: Secondary | ICD-10-CM

## 2019-07-01 NOTE — Telephone Encounter (Signed)
Amber Lloyd,  Dr Isaiah Serge had recommended to patient at her 1.25.21 video visit to increase her O2 to 5L as needed.  Patient stated that she realized after visit that she is already on 4.5L and has increased it to 5L which is the max for that machine.  Patient is not sick, but since Dr Isaiah Serge will not be back in the office until 2/22, may we send an order for a home concentrator and portable system that will support greater than 5L?  Thanks so much!   Per 1.25.21 visit: Observations/Objective: She is stable post hospitalization.  She has significant fatigue and dyspnea which preceded her COVID-19 diagnosis Continues on supplemental oxygen at 4 L with desats on exertion Continue on Ofev and Stiolto  Assessment and Plan: IPF Continue Ofev Continue supplemental oxygen.  Can go up to 5 L as needed for desats  Emphysema Continue Stiolto Respimat  Follow Up Instructions: 3 months.

## 2019-07-02 NOTE — Telephone Encounter (Signed)
Clydie Braun from [facility] called to get the qualifying O2 stats for pt. --[phone]

## 2019-07-02 NOTE — Telephone Encounter (Signed)
Called spoke with Clydie Braun w/ [facility] who reported that she has already spoken with patient.  [facility] does not carry concentrators that go over 5L.   Clydie Braun stated she did speak with [facility] who does carry home concentrators that go over 5L but they do not carry POCs (per Clydie Braun, pt self-purchased the POC she has now).    Patient was just seen by Dr Isaiah Serge last week on 1.25.21.  Discussed with [facility] - since there was some miscommunication between the pt and Dr Isaiah Serge about how much O2 she was using at home and the documentation will not match the qualifying walk, it would be best to have pt come back in the office for both the qualifying walk and a visit.  E-mail sent to patient

## 2019-07-06 NOTE — Telephone Encounter (Signed)
Called and spoke with pt as she sent mychart response back. Pt has been scheduled for appt with Edmonia James. 2/11 at 1:30 and a comment was made that pt will need to have qualifying walk due to switching DMEs. Nothing further needed.

## 2019-07-08 ENCOUNTER — Telehealth: Payer: Self-pay | Admitting: Pulmonary Disease

## 2019-07-08 NOTE — Progress Notes (Signed)
@Patient  ID: , female    DOB: 09/17/1955, 64 y.o.   MRN: [id number]  Chief Complaint  Patient presents with  . Follow-up    IPF, s/p covid in 02/2019    Referring provider: 03/2019  HPI:  64 year old female followed in our office for known IPF and pulmonary emphysema.   Diagnosed positive for SARS-CoV-2 on 03/19/2019.  Hospitalized from 03/19/2019-03/24/2019 at [facility], managed by hospitalist team there.  Received Decadron and remdesivir.  Did not require mechanical ventilation.  Past medical history: Anxiety, anemia, alpha-1 antitrypsin deficiency carrier Smoking history: Former smoker.  Quit 2009.  34-pack-year smoking history Maintenance: Ofev, Stiolto Respimat Patient of Dr. 2010  07/09/2019  - Visit   64 year old female former smoker followed in our office for IPF as well as pulmonary emphysema.  Patient presented to our office today as a requalifying walk in order to transition to a different DME company.  Patient also needed assistance with her Stiolto Respimat application with [employer] as well as her Ofev patient assistance application.  Patient last completed a video visit with our office in January/2021 with Dr. February/2021.  At that visit she was encouraged to remain on Ofev, continue supplemental oxygen with the ability to go up to 5 L as needed for oxygen desaturations.  And to continue Stiolto Respimat.  She was encouraged to follow-up in 3 months.  Patient presenting to our office today as a qualifying walk to be able to transition to a different DME company.  They are also concerned regarding patient's worsened fatigue.  Patient and daughter confirm that patient's fatigue has been worse ever since she was diagnosed with Covid in October/2020.  They do not believe that she is back to baseline yet.  They are also concerned regarding the patient's oxygen levels at home.  With physical exertion she is dropping to the 60s.  Patient does have a POC.   Patient needs a new DME company to receive a new concentrator and likely oxygen tanks.  We will walk the patient today.  Questionaires / Pulmonary Flowsheets:   MMRC: mMRC Dyspnea Scale mMRC Score  07/09/2019 1    Tests:   03/19/2019-SARS-CoV-2-positive  03/19/2019-chest x-ray-chronic interstitial lung disease, vague bibasilar opacities may be related to interstitial lung disease or known COVID-19 pneumonia  11/19/2017-CT chest high-res-slight progression of disease compared to prior study, CT findings are now considered a typical UIP, diffuse bronchial wall thickening with moderate centrilobular mild paraseptal emphysema, mildly dilated pulmonary trunk  07/23/2018-spirometry with DLCO -FVC 2.02 (63% predicted), ratio 81, FEV1 1.64 (67% predicted, DLCO 5.75 (28% predicted)   FENO:  No results found for: NITRICOXIDE  PFT: PFT Results Latest Ref Rng & Units 07/23/2018 11/21/2017 06/25/2017  FVC-Pre L 2.02 2.31 2.23  FVC-Predicted Pre % 63 71 69  FVC-Post L - - 2.37  FVC-Predicted Post % - - 73  Pre FEV1/FVC % % 81 82 84  Post FEV1/FCV % % - - 83  FEV1-Pre L 1.64 1.88 1.87  FEV1-Predicted Pre % 67 76 75  FEV1-Post L - - 1.97  DLCO UNC% % 28 25 20   DLCO COR %Predicted % 43 42 33  TLC L - - 3.79  TLC % Predicted % - - 75  RV % Predicted % - - 67    WALK:  SIX MIN WALK 12/24/2017 11/18/2017 07/19/2017  Medications Ofev,Iron,Omeprazole,Sertraline albuterol neb,iron,omeprazole,sertraline,ofev,multi vit at 0745 -  Supplimental Oxygen during Test? (L/min) No Yes No  O2 Flow Rate  2 2 -  Type Pulse Pulse -  Laps 3 3 -  Partial Lap (in Meters) 1 0 -  Baseline BP (sitting) 110/62 132/60 -  Baseline Heartrate 83 66 -  Baseline Dyspnea (Borg Scale) 2 3 -  Baseline Fatigue (Borg Scale) 5 3 -  Baseline SPO2 98 95 -  BP (sitting) 136/70 138/72 -  Heartrate 79 86 -  Dyspnea (Borg Scale) 5 4 -  Fatigue (Borg Scale) 7 7 -  SPO2 87 92 -  BP (sitting) 126/72 132/64 -  Heartrate 86 83 -   SPO2 95 97 -  Stopped or Paused before Six Minutes Yes Yes -  Other Symptoms at end of Exercise lightheaded x's 2  amb slow, needed to stop after going around cone, because of dizzyness -  Interpretation Dizziness Dizziness;Leg pain -  Distance Completed 145 144 -  Tech Comments: - - stopped mid lap 2 due to dizziness and low O2sats, Dr. Isaiah Serge wants O2 with exertion and at night.    Imaging: No results found.  Lab Results:  CBC    Component Value Date/Time   WBC 9.1 04/12/2019 1632   RBC 4.22 04/12/2019 1632   HGB 14.1 04/12/2019 1632   HCT 43.1 04/12/2019 1632   PLT 151 04/12/2019 1632   MCV 102.1 (H) 04/12/2019 1632   MCH 33.4 04/12/2019 1632   MCHC 32.7 04/12/2019 1632   RDW 13.1 04/12/2019 1632   LYMPHSABS 0.6 (L) 03/24/2019 0408   MONOABS 0.2 03/24/2019 0408   EOSABS 0.0 03/24/2019 0408   BASOSABS 0.0 03/24/2019 0408    BMET    Component Value Date/Time   NA 141 04/12/2019 1632   K 3.0 (L) 04/12/2019 1632   CL 104 04/12/2019 1632   CO2 28 04/12/2019 1632   GLUCOSE 134 (H) 04/12/2019 1632   BUN 23 04/12/2019 1632   CREATININE 0.79 04/12/2019 1632   CREATININE 0.70 01/17/2016 0854   CALCIUM 9.5 04/12/2019 1632   GFRNONAA >60 04/12/2019 1632   GFRAA >60 04/12/2019 1632    BNP No results found for: BNP  ProBNP No results found for: PROBNP  Specialty Problems      Pulmonary Problems   Pulmonary emphysema (HCC)    11/19/2017-CT chest high-res-slight progression of disease compared to prior study, CT findings are now considered a typical UIP, diffuse bronchial wall thickening with moderate centrilobular mild paraseptal emphysema, mildly dilated pulmonary trunk  07/23/2018-spirometry with DLCO -FVC 2.02 (63% predicted), ratio 81, FEV1 1.64 (67% predicted, DLCO 5.75 (28% predicted)      Interstitial pulmonary disease (HCC)   IPF (idiopathic pulmonary fibrosis) (HCC)    11/19/2017-CT chest high-res-slight progression of disease compared to prior study, CT  findings are now considered a typical UIP, diffuse bronchial wall thickening with moderate centrilobular mild paraseptal emphysema, mildly dilated pulmonary trunk  07/23/2018-spirometry with DLCO -FVC 2.02 (63% predicted), ratio 81, FEV1 1.64 (67% predicted, DLCO 5.75 (28% predicted)      Acute non-recurrent maxillary sinusitis   Pneumonia due to COVID-19 virus    03/19/2019-SARS-CoV-2-positive  03/19/2019-chest x-ray-chronic interstitial lung disease, vague bibasilar opacities may be related to interstitial lung disease or known COVID-19 pneumonia  Diagnosed positive for SARS-CoV-2 on 03/19/2019.  Hospitalized from 03/19/2019-03/24/2019 at [facility], managed by hospitalist team there.  Received Decadron and remdesivir.  Did not require mechanical ventilation.      Chronic respiratory failure with hypoxia (HCC)      No Known Allergies  Immunization History  Administered Date(s) Administered  .  Influenza,inj,Quad PF,6+ Mos 02/28/2017, 02/13/2018  . Pneumococcal Polysaccharide-23 11/18/2017    Past Medical History:  Diagnosis Date  . Anemia   . Anxiety   . COVID-19   . Depression   . GERD (gastroesophageal reflux disease)   . IBS (irritable bowel syndrome)   . ILD (interstitial lung disease) (HCC)   . Pulmonary fibrosis (HCC)     Tobacco History: Social History   Tobacco Use  Smoking Status Former Smoker  . Packs/day: 1.00  . Years: 34.00  . Pack years: 34.00  . Types: Cigarettes  . Quit date: 07/27/2007  . Years since quitting: 11.9  Smokeless Tobacco Never Used   Counseling given: Yes  Continue to not smoke  Outpatient Encounter Medications as of 07/09/2019  Medication Sig  . albuterol (PROVENTIL) (2.5 MG/3ML) 0.083% nebulizer solution INHALE THE CONTENTS OF ONE VIAL VIA NEBULIZER EVERY 6 HOURS AS NEEDED FOR WHEEZING OR SHORTNESS OF BREATH (Patient taking differently: Take 2.5 mg by nebulization every 6 (six) hours as needed for wheezing or shortness of breath. )   . Amino Acids-Protein Hydrolys (FEEDING SUPPLEMENT, PRO-STAT SUGAR FREE 64,) LIQD Take 30 mLs by mouth 2 (two) times daily.  . calcium carbonate (TUMS CHEWY BITES) 750 MG chewable tablet Chew 1-2 tablets by mouth daily as needed for heartburn.  . clonazePAM (KLONOPIN) 1 MG tablet TAKE 1 TABLET BY MOUTH AT BEDTIME (Patient taking differently: Take 1 mg by mouth at bedtime. )  . dexamethasone (DECADRON) 2 MG tablet Take 6 mg by mouth daily.  Marland Kitchen guaiFENesin (MUCINEX) 600 MG 12 hr tablet Take 600 mg by mouth 2 (two) times daily.  Marland Kitchen guaiFENesin (ROBITUSSIN) 100 MG/5ML liquid Take 200 mg by mouth 3 (three) times daily as needed for cough.  Marland Kitchen HYDROcodone-acetaminophen (NORCO/VICODIN) 5-325 MG tablet Take 1 tablet by mouth every 6 (six) hours as needed.  Marland Kitchen ibuprofen (ADVIL) 200 MG tablet Take 200 mg by mouth daily as needed. For shoulder pain and headaches   . IRON PO Take 1 tablet by mouth daily.  . Multiple Vitamins-Minerals (MULTIVITAMIN WITH MINERALS) tablet Take 1 tablet by mouth daily.  . Nintedanib (OFEV) 150 MG CAPS Take 150 mg by mouth 2 (two) times daily.  Marland Kitchen omeprazole (PRILOSEC) 40 MG capsule Take 1 capsule (40 mg total) by mouth daily.  . ondansetron (ZOFRAN) 4 MG tablet Take 1 tablet (4 mg total) by mouth daily as needed for nausea or vomiting.  . OXYGEN Inhale 4 L into the lungs continuous.   . phenazopyridine (PYRIDIUM) 200 MG tablet Take 1 tablet (200 mg total) by mouth 3 (three) times daily as needed (for pain with urination).  Marland Kitchen PROVENTIL HFA 108 (90 Base) MCG/ACT inhaler INHALE 2 PUFFS BY MOUTH EVERY 6 HOURS AS NEEDED FOR COUGHING, WHEEZING, OR SHORTNESS OF BREATH (Patient taking differently: Inhale 1-2 puffs into the lungs every 6 (six) hours as needed for wheezing or shortness of breath. )  . sertraline (ZOLOFT) 50 MG tablet Take 2 tablets (100 mg total) by mouth daily.  . Tiotropium Bromide-Olodaterol (STIOLTO RESPIMAT) 2.5-2.5 MCG/ACT AERS Inhale 2 puffs into the lungs daily.   No  facility-administered encounter medications on file as of 07/09/2019.     Review of Systems  Review of Systems  Constitutional: Positive for activity change and fatigue. Negative for fever.  HENT: Negative for sinus pressure, sinus pain and sore throat.   Respiratory: Positive for cough and shortness of breath. Negative for wheezing.   Cardiovascular: Negative for chest pain and palpitations.  Gastrointestinal: Negative for diarrhea, nausea and vomiting.  Musculoskeletal: Positive for gait problem. Negative for arthralgias.  Neurological: Positive for weakness. Negative for dizziness.  Psychiatric/Behavioral: Negative for sleep disturbance. The patient is not nervous/anxious.      Physical Exam  BP 112/68   Pulse 99   Temp 98.4 F (36.9 C) (Temporal)   Ht 5\' 4"  (1.626 m)   Wt 176 lb (79.8 kg)   SpO2 91%   BMI 30.21 kg/m   Wt Readings from Last 5 Encounters:  07/09/19 176 lb (79.8 kg)  04/13/19 176 lb 6.4 oz (80 kg)  04/12/19 180 lb (81.6 kg)  03/19/19 170 lb (77.1 kg)  04/30/18 166 lb 12.8 oz (75.7 kg)    BMI Readings from Last 5 Encounters:  07/09/19 30.21 kg/m  04/13/19 30.28 kg/m  04/12/19 30.90 kg/m  03/19/19 29.18 kg/m  07/07/18 28.63 kg/m     Physical Exam Vitals and nursing note reviewed.  Constitutional:      General: She is not in acute distress.    Appearance: Normal appearance. She is normal weight.     Comments: Thin frail elderly female  HENT:     Head: Normocephalic and atraumatic.     Right Ear: Tympanic membrane, ear canal and external ear normal. There is no impacted cerumen.     Left Ear: Tympanic membrane, ear canal and external ear normal. There is no impacted cerumen.  Eyes:     Pupils: Pupils are equal, round, and reactive to light.  Cardiovascular:     Rate and Rhythm: Normal rate and regular rhythm.     Pulses: Normal pulses.     Heart sounds: Normal heart sounds. No murmur.  Pulmonary:     Breath sounds: No decreased air  movement. Rales (Bibasilar crackles) present. No decreased breath sounds or wheezing.  Abdominal:     General: Abdomen is flat. Bowel sounds are normal.     Palpations: Abdomen is soft.  Musculoskeletal:     Cervical back: Normal range of motion.  Skin:    General: Skin is warm and dry.     Capillary Refill: Capillary refill takes less than 2 seconds.  Neurological:     General: No focal deficit present.     Mental Status: She is alert and oriented to person, place, and time. Mental status is at baseline.     Motor: Weakness present.     Gait: Gait abnormal.  Psychiatric:        Mood and Affect: Mood normal.        Behavior: Behavior normal.        Thought Content: Thought content normal.        Judgment: Judgment normal.       Assessment & Plan:   Pneumonia due to COVID-19 virus Tested positive for Covid in October/2020 Hospitalized in late October/2020 Did not require mechanical ventilation  Plan: X-ray today Walk today in office Likely need to consider high-resolution CT chest sometime over the next 2 to 3 months, will discuss with Dr. Isaiah Serge  IPF (idiopathic pulmonary fibrosis) (HCC) Plan: Continue Ofev  Chronic respiratory failure with hypoxia (HCC) Having episodes of hypoxemia at home when ambulating oxygen dropping to the 60s, requiring 5 L continuous Family has concerns that concentrator at home is not working properly, hoping to switch DME companies now that patient has insurance  Plan: Walk today in office Maintain oxygen saturations above 88 to 90% Chest x-ray today Follow-up with our office in 4 to 6  weeks  Pulmonary emphysema ([place]) Plan: Continue Stiolto    Return in about 4 weeks (around 08/06/2019), or if symptoms worsen or fail to improve, for Follow up with Wyn Quaker FNP-C, Follow up with Dr. Vaughan Browner.   Lauraine Rinne, NP 07/09/2019   This appointment required 42 minutes of patient care (this includes precharting, chart review, review of  results, face-to-face care, etc.).

## 2019-07-08 NOTE — Telephone Encounter (Signed)
Spoke with patient. She stated that she spoke with [facility] and they stated that they have not received any paperwork from our office. She has been having issues with them for the past 2 weeks. 2 weeks ago they stated that they did receive the paperwork. I advised her that since the forms had been scanned into her chart, I would print out the application and fax it to the number she provided. She verbalized understanding.   She also stated that she will need to have her Ofev application sent in as well. She is not completely out of Ofev since they shipped a shipment to her this week. Received the forms and will place in Dr. Shirlee More folder for his signature.   Nothing further needed at time of call.

## 2019-07-09 ENCOUNTER — Encounter: Payer: Self-pay | Admitting: Pulmonary Disease

## 2019-07-09 ENCOUNTER — Ambulatory Visit (INDEPENDENT_AMBULATORY_CARE_PROVIDER_SITE_OTHER): Payer: Medicare Other | Admitting: Pulmonary Disease

## 2019-07-09 ENCOUNTER — Ambulatory Visit (INDEPENDENT_AMBULATORY_CARE_PROVIDER_SITE_OTHER): Payer: Medicare Other

## 2019-07-09 ENCOUNTER — Other Ambulatory Visit: Payer: Self-pay

## 2019-07-09 VITALS — BP 112/68 | HR 99 | Temp 98.4°F | Ht 64.0 in | Wt 176.0 lb

## 2019-07-09 DIAGNOSIS — J9611 Chronic respiratory failure with hypoxia: Secondary | ICD-10-CM | POA: Diagnosis not present

## 2019-07-09 DIAGNOSIS — U071 COVID-19: Secondary | ICD-10-CM

## 2019-07-09 DIAGNOSIS — J84112 Idiopathic pulmonary fibrosis: Secondary | ICD-10-CM | POA: Diagnosis not present

## 2019-07-09 DIAGNOSIS — Z148 Genetic carrier of other disease: Secondary | ICD-10-CM

## 2019-07-09 DIAGNOSIS — J1282 Pneumonia due to coronavirus disease 2019: Secondary | ICD-10-CM | POA: Diagnosis not present

## 2019-07-09 DIAGNOSIS — J439 Emphysema, unspecified: Secondary | ICD-10-CM

## 2019-07-09 MED ORDER — STIOLTO RESPIMAT 2.5-2.5 MCG/ACT IN AERS: 2.0000 | INHALATION_SPRAY | Freq: Every day | RESPIRATORY_TRACT | 0 refills | Status: AC

## 2019-07-09 NOTE — Assessment & Plan Note (Signed)
Tested positive for Covid in October/2020 Hospitalized in late October/2020 Did not require mechanical ventilation  Plan: X-ray today Walk today in office Likely need to consider high-resolution CT chest sometime over the next 2 to 3 months, will discuss with Dr. Isaiah Serge

## 2019-07-09 NOTE — Assessment & Plan Note (Signed)
Having episodes of hypoxemia at home when ambulating oxygen dropping to the 60s, requiring 5 L continuous Family has concerns that concentrator at home is not working properly, hoping to switch DME companies now that patient has insurance  Plan: Walk today in office Maintain oxygen saturations above 88 to 90% Chest x-ray today Follow-up with our office in 4 to 6 weeks

## 2019-07-09 NOTE — Assessment & Plan Note (Signed)
Plan: Continue Stiolto

## 2019-07-09 NOTE — Assessment & Plan Note (Signed)
Plan: Continue Ofev

## 2019-07-09 NOTE — Patient Instructions (Addendum)
You were seen today by Coral Ceo, NP  for:   1. IPF (idiopathic pulmonary fibrosis) (HCC)  - DG Chest 2 View; Future  Continue OFEV  2. Chronic respiratory failure with hypoxia (HCC)  - DG Chest 2 View; Future  Continue oxygen therapy as prescribed - 5L  >>>maintain oxygen saturations greater than 88 percent  >>>if unable to maintain oxygen saturations please contact the office  >>>do not smoke with oxygen  >>>can use nasal saline gel or nasal saline rinses to moisturize nose if oxygen causes dryness  3. Pneumonia due to COVID-19 virus  - DG Chest 2 View; Future  May need to consider high-resolution CT chest sometime over the next 2 to 3 months to further evaluate breathing, we will proceed forward with x-ray today to further evaluate.  4. Alpha-1-antitrypsin deficiency carrier 5. Pulmonary emphysema, unspecified emphysema type (HCC)  Stiolto Respimat inhaler >>>2 puffs daily >>>Take this no matter what >>>This is not a rescue inhaler  Note your daily symptoms > remember "red flags" for COPD:   >>>Increase in cough >>>increase in sputum production >>>increase in shortness of breath or activity  intolerance.   If you notice these symptoms, please call the office to be seen.     We recommend today:  Orders Placed This Encounter  Procedures  . DG Chest 2 View    Standing Status:   Future    Number of Occurrences:   1    Standing Expiration Date:   09/05/2020    Order Specific Question:   Reason for Exam (SYMPTOM  OR DIAGNOSIS REQUIRED)    Answer:   ipf, s/p covid in 2020    Order Specific Question:   Preferred imaging location?    Answer:   Internal    Order Specific Question:   Radiology Contrast Protocol - do NOT remove file path    Answer:   [email or web address]   Orders Placed This Encounter  Procedures  . DG Chest 2 View   No orders of the defined types were placed in this encounter.   Follow Up:    Return in  about 4 weeks (around 08/06/2019), or if symptoms worsen or fail to improve, for Follow up with Elisha Headland FNP-C, Follow up with Dr. Isaiah Serge.   Please do your part to reduce the spread of COVID-19:      Reduce your risk of any infection  and COVID19 by using the similar precautions used for avoiding the common cold or flu:  Marland Kitchen Wash your hands often with soap and warm water for at least 20 seconds.  If soap and water are not readily available, use an alcohol-based hand sanitizer with at least 60% alcohol.  . If coughing or sneezing, cover your mouth and nose by coughing or sneezing into the elbow areas of your shirt or coat, into a tissue or into your sleeve (not your hands). Drinda Butts A MASK when in public  . Avoid shaking hands with others and consider head nods or verbal greetings only. . Avoid touching your eyes, nose, or mouth with unwashed hands.  . Avoid close contact with people who are sick. . Avoid places or events with large numbers of people in one location, like concerts or sporting events. . If you have some symptoms but not all symptoms, continue to monitor at home and seek medical attention if your symptoms worsen. . If you are having a medical emergency, call 911.   ADDITIONAL HEALTHCARE OPTIONS FOR PATIENTS  [facility]  Telehealth / e-Visit: [email or web address]         [facility]: [facility]: [phone]                   [facility]: [phone]     It is flu season:   >>> Best ways to protect herself from the flu: Receive the yearly flu vaccine, practice good hand hygiene washing with soap and also using hand sanitizer when available, eat a nutritious meals, get adequate rest, hydrate appropriately   Please contact the office if your symptoms worsen or you have concerns that you are not improving.   Thank you for choosing [facility] for your healthcare,  and for allowing [place] to partner with you on your healthcare journey. I am thankful to be able to provide care to you today.   Wyn Quaker FNP-C

## 2019-07-10 NOTE — Progress Notes (Signed)
Spoke with the pt and notified of results and recommendations per Amber Lloyd and she verbalized understanding and denied any questions.

## 2019-07-24 ENCOUNTER — Other Ambulatory Visit: Payer: Self-pay | Admitting: Pulmonary Disease

## 2019-07-28 ENCOUNTER — Telehealth: Payer: Self-pay | Admitting: Pulmonary Disease

## 2019-07-28 DIAGNOSIS — J439 Emphysema, unspecified: Secondary | ICD-10-CM

## 2019-07-28 DIAGNOSIS — J84112 Idiopathic pulmonary fibrosis: Secondary | ICD-10-CM

## 2019-07-28 DIAGNOSIS — U071 COVID-19: Secondary | ICD-10-CM

## 2019-07-28 NOTE — Telephone Encounter (Signed)
Called [address], spoke with [place].  Lawson Fiscal stated Patient has been using [facility] for O2, but has been paying out of pocket, never filing on insurance.  Patient does want [address], but insurance has denied, needing reason why in O2 note. Lawson Fiscal stated order reading switching to [place] may be the problem.   06/29/19 O2 order-  Route (nasal cannula OR mask): nasal cannula Liter Flow: 5L Frequency (continuous with stationary and portable oxygen unit needed OR only at night): continuous with stationary and POC needed Length of Need: Lifetime Was this based off an ONO?: No  DME: [place] (Currently with [employer], she wishes to switch to [employer]) Oxygen Conserving Device (Yes or No): Yes Type of Oxygen: Gas If new O2 start, please evaluate and titrate for best fit POC or portable O2 system.  Message routed to [place], [facility] to assist

## 2019-07-30 NOTE — Telephone Encounter (Signed)
Faxed new order to [employer].

## 2019-07-30 NOTE — Telephone Encounter (Signed)
I believe the solution would be to change the wording on the order to not include current DME that pt is getting O2 from - ONLY should have the desired DME.

## 2019-07-30 NOTE — Telephone Encounter (Signed)
PCC can you help with this? 

## 2019-07-30 NOTE — Telephone Encounter (Signed)
The order has been re-entered without the notation of prior DME company.

## 2019-08-11 ENCOUNTER — Telehealth: Payer: Self-pay | Admitting: Pulmonary Disease

## 2019-08-11 DIAGNOSIS — J84112 Idiopathic pulmonary fibrosis: Secondary | ICD-10-CM

## 2019-08-11 NOTE — Telephone Encounter (Signed)
Spoke with pt, she states that [facility] has not received her prescription for [employer]. I called [employer] and they stated she was approved for assistance but needed a Rx sent in. I spoke with a pharmacist named Tereso Newcomer and she took the verbal Rx for [employer]. I called pt back to let her know and nothing further is needed.

## 2019-08-26 NOTE — Progress Notes (Signed)
Virtual Visit via Telephone Note  I connected with Amber Lloyd on 08/27/19 at  2:30 PM EDT by telephone and verified that I am speaking with the correct person using two identifiers.  Location: Patient: Home Provider: Office [facility] - [address] [place], [address], [place], [place] [address]   I discussed the limitations, risks, security and privacy concerns of performing an evaluation and management service by telephone and the availability of in person appointments. I also discussed with the patient that there may be a patient responsible charge related to this service. The patient expressed understanding and agreed to proceed.  Patient consented to consult via telephone: Yes People present and their role in pt care: Pt   History of Present Illness:  64 year old female followed in our office for known IPF and pulmonary emphysema.   Diagnosed positive for SARS-CoV-2 on 03/19/2019.  Hospitalized from 03/19/2019-03/24/2019 at [facility], managed by hospitalist team there.  Received Decadron and remdesivir.  Did not require mechanical ventilation.  Past medical history: Anxiety, anemia, alpha-1 antitrypsin deficiency carrier Smoking history: Former smoker.  Quit 2009.  34-pack-year smoking history Maintenance: Ofev, Stiolto Respimat Patient of Dr. Isaiah Serge  Chief complaint: Issues with O3   64 year old female former smoker followed in our office for IPF, pulmonary emphysema, status post SARS-CoV-2 infection October/2020 required hospitalization.  This is a televisit that was set up in order to help coordinate patient's oxygen needs.  Unfortunately at last office visit in February/2021 we referred the patient to be established with [facility] so the patient could be maintained on continuous oxygen.  At that time it was felt the patient needed 5 L continuous in order to maintain oxygen saturations above 88%.  [facility] took the referral but then asked the patient  to pay cash out-of-pocket $325.  Then they said that her insurance was denied.  We will discuss this today.  Patient is concerned because she is reporting now that her oxygen levels are dropping even lower than February/2021.  She reports that on 5 L her oxygen levels with physical exertion are dropping to the 60s.  She reports that on 6 L with physical exertion her oxygen levels are dropping to the low 80s.  She has a pulse oximeter that she is monitoring her oxygen at home.  It is very difficult for the patient to get in to the office or go to appointments due to her high oxygen needs.  We will work on this today.  Patient continues to have ongoing cough and congestion.  Her daughter has done most of her communication on today's visit due to her cough.  Observations/Objective:  03/19/2019-SARS-CoV-2-positive  03/19/2019-chest x-ray-chronic interstitial lung disease, vague bibasilar opacities may be related to interstitial lung disease or known COVID-19 pneumonia  11/19/2017-CT chest high-res-slight progression of disease compared to prior study, CT findings are now considered a typical UIP, diffuse bronchial wall thickening with moderate centrilobular mild paraseptal emphysema, mildly dilated pulmonary trunk  07/23/2018-spirometry with DLCO -FVC 2.02 (63% predicted), ratio 81, FEV1 1.64 (67% predicted, DLCO 5.75 (28% predicted)  Social History   Tobacco Use  Smoking Status Former Smoker  . Packs/day: 1.00  . Years: 34.00  . Pack years: 34.00  . Types: Cigarettes  . Quit date: 07/27/2007  . Years since quitting: 12.0  Smokeless Tobacco Never Used   Immunization History  Administered Date(s) Administered  . Influenza,inj,Quad PF,6+ Mos 02/28/2017, 02/13/2018  . Pneumococcal Polysaccharide-23 11/18/2017    Assessment and Plan:  Complex  care coordination High oxygen needs Complex care High out-of-pocket cost with current DME company [employer] Current DME company reporting  that patient's oxygen was denied by insurance  Discussion: I contacted [facility].  Explained the case.  They were confused on why the patient's insurance would be denied.  Upon further review [facility] is willing to take the patient on.  Her out-of-pocket co-pay with [facility] would be $4.58 with her insurance.  They will work on getting the patient set up as quickly as possible.  1 set up and established with [facility] we can stop oxygen therapy from [employer].  Plan: Establish oxygen therapy with [facility] 8 L of oxygen with physical exertion We will work with home health in order to get patient evaluated in the home to further understand what her oxygen needs are Also discussed with patient over the phone that we may need to be considering hospice given high oxygen needs and limitations with care  IPF (idiopathic pulmonary fibrosis) ([place]) Plan: Continue Ofev at this time Continue oxygen therapy Maintain oxygen saturations above 88% Established with [facility] for oxygen We will follow the patient next week to discuss hospice referral, patient to discuss with family  Chronic respiratory failure with hypoxia ([place]) High oxygen needs Established with [employer], difficulties with billing  Under further evaluation today on the telephone visit was able to get the patient established with [facility].  We will switch DME companies today.  Plan: Continue oxygen therapy at this time Sounds like patient likely needs 8 L continuous We will work with [facility] to see how to best get the patient assessed for oxygen levels in her home Emphasized the patient as well as daughter today that oxygen levels need to be above 88% We will need to discuss with patient next week hospice versus palliative referrals as oxygen levels are worsening   Follow Up Instructions:  Return in about 5 days (around 09/01/2019), or if symptoms worsen or fail to improve, for Follow up with Wyn Quaker FNP-C.   I  discussed the assessment and treatment plan with the patient. The patient was provided an opportunity to ask questions and all were answered. The patient agreed with the plan and demonstrated an understanding of the instructions.   The patient was advised to call back or seek an in-person evaluation if the symptoms worsen or if the condition fails to improve as anticipated.  I provided 45 minutes of non-face-to-face time during this encounter.   Lauraine Rinne, NP

## 2019-08-27 ENCOUNTER — Other Ambulatory Visit: Payer: Self-pay

## 2019-08-27 ENCOUNTER — Encounter: Payer: Self-pay | Admitting: Pulmonary Disease

## 2019-08-27 ENCOUNTER — Ambulatory Visit (INDEPENDENT_AMBULATORY_CARE_PROVIDER_SITE_OTHER): Payer: Medicare Other | Admitting: Pulmonary Disease

## 2019-08-27 ENCOUNTER — Telehealth: Payer: Self-pay | Admitting: Pulmonary Disease

## 2019-08-27 DIAGNOSIS — Z7189 Other specified counseling: Secondary | ICD-10-CM | POA: Insufficient documentation

## 2019-08-27 DIAGNOSIS — J9611 Chronic respiratory failure with hypoxia: Secondary | ICD-10-CM | POA: Diagnosis not present

## 2019-08-27 DIAGNOSIS — J84112 Idiopathic pulmonary fibrosis: Secondary | ICD-10-CM

## 2019-08-27 NOTE — Assessment & Plan Note (Signed)
Plan: Continue Ofev at this time Continue oxygen therapy Maintain oxygen saturations above 88% Established with [facility] for oxygen We will follow the patient next week to discuss hospice referral, patient to discuss with family

## 2019-08-27 NOTE — Assessment & Plan Note (Signed)
High oxygen needs Established with [facility], difficulties with billing  Under further evaluation today on the telephone visit was able to get the patient established with [facility].  We will switch DME companies today.  Plan: Continue oxygen therapy at this time Sounds like patient likely needs 8 L continuous We will work with [facility] to see how to best get the patient assessed for oxygen levels in her home Emphasized the patient as well as daughter today that oxygen levels need to be above 88% We will need to discuss with patient next week hospice versus palliative referrals as oxygen levels are worsening

## 2019-08-27 NOTE — Patient Instructions (Addendum)
You were seen today by Coral Ceo, NP  for:   We talked over the phone today.  I worked on getting you set up with a new durable medical equipment company to get oxygen through.  They should be contacting you.  My name is [facility].  Once you are established with them we will work on stopping the oxygen through [employer].  You need to maintain your oxygen saturations above 88%.  We will follow up and regroup next week over the phone on Tuesday.  Continue all of your other recommended medications  Give [place] a call if you have additional questions or concerns.  Elisha Headland, FNP  1. Chronic respiratory failure with hypoxia (HCC)  - Ambulatory Referral for DME  2. IPF (idiopathic pulmonary fibrosis) (HCC)   3. Complex care coordination    We recommend today:  Orders Placed This Encounter  Procedures  . Ambulatory Referral for DME    Referral Priority:   Urgent    Referral Type:   Durable Medical Equipment Purchase    Number of Visits Requested:   1   Orders Placed This Encounter  Procedures  . Ambulatory Referral for DME   No orders of the defined types were placed in this encounter.   Follow Up:    Return in about 5 days (around 09/01/2019), or if symptoms worsen or fail to improve, for Follow up with Elisha Headland FNP-C.   Please do your part to reduce the spread of COVID-19:      Reduce your risk of any infection  and COVID19 by using the similar precautions used for avoiding the common cold or flu:  Marland Kitchen Wash your hands often with soap and warm water for at least 20 seconds.  If soap and water are not readily available, use an alcohol-based hand sanitizer with at least 60% alcohol.  . If coughing or sneezing, cover your mouth and nose by coughing or sneezing into the elbow areas of your shirt or coat, into a tissue or into your sleeve (not your hands). Drinda Butts A MASK when in public  . Avoid shaking hands with others and consider head nods or verbal greetings  only. . Avoid touching your eyes, nose, or mouth with unwashed hands.  . Avoid close contact with people who are sick. . Avoid places or events with large numbers of people in one location, like concerts or sporting events. . If you have some symptoms but not all symptoms, continue to monitor at home and seek medical attention if your symptoms worsen. . If you are having a medical emergency, call 911.   ADDITIONAL HEALTHCARE OPTIONS FOR PATIENTS  [place] Telehealth / e-Visit: [email or web address]         [facility]: [phone]  [facility]: [phone]                   [facility]: [phone]     It is flu season:   >>> Best ways to protect herself from the flu: Receive the yearly flu vaccine, practice good hand hygiene washing with soap and also using hand sanitizer when available, eat a nutritious meals, get adequate rest, hydrate appropriately   Please contact the office if your symptoms worsen or you have concerns that you are not improving.   Thank you for choosing [facility] for your healthcare, and for allowing [place] to partner with you on your healthcare journey. I am thankful to be  able to provide care to you today.   Wyn Quaker FNP-C

## 2019-08-27 NOTE — Assessment & Plan Note (Signed)
High oxygen needs Complex care High out-of-pocket cost with current DME company [employer] [facility] Current DME company reporting that patient's oxygen was denied by insurance  Discussion: I contacted [facility].  Explained the case.  They were confused on why the patient's insurance would be denied.  Upon further review [facility] is willing to take the patient on.  Her out-of-pocket co-pay with [facility] would be $4.58 with her insurance.  They will work on getting the patient set up as quickly as possible.  1 set up and established with [facility] we can stop oxygen therapy from [employer].  Plan: Establish oxygen therapy with [facility] 8 L of oxygen with physical exertion We will work with home health in order to get patient evaluated in the home to further understand what her oxygen needs are Also discussed with patient over the phone that we may need to be considering hospice given high oxygen needs and limitations with care

## 2019-08-27 NOTE — Telephone Encounter (Signed)
Called [employer] and spoke with Leah.  Tacey Ruiz saw [facility] note on snapshot dated 07/09/19. Cherina, CMA has date of 08/27/19 on note at bottom.  Leah aware of date.  O2 qualifications accepted. Nothing further at this time.

## 2019-09-01 ENCOUNTER — Encounter: Payer: Self-pay | Admitting: Pulmonary Disease

## 2019-09-01 ENCOUNTER — Other Ambulatory Visit: Payer: Self-pay

## 2019-09-01 ENCOUNTER — Ambulatory Visit (INDEPENDENT_AMBULATORY_CARE_PROVIDER_SITE_OTHER): Payer: Medicare Other | Admitting: Pulmonary Disease

## 2019-09-01 DIAGNOSIS — J84112 Idiopathic pulmonary fibrosis: Secondary | ICD-10-CM

## 2019-09-01 DIAGNOSIS — Z7189 Other specified counseling: Secondary | ICD-10-CM | POA: Diagnosis not present

## 2019-09-01 DIAGNOSIS — J9611 Chronic respiratory failure with hypoxia: Secondary | ICD-10-CM | POA: Diagnosis not present

## 2019-09-01 DIAGNOSIS — Z87891 Personal history of nicotine dependence: Secondary | ICD-10-CM

## 2019-09-01 DIAGNOSIS — Z9981 Dependence on supplemental oxygen: Secondary | ICD-10-CM | POA: Diagnosis not present

## 2019-09-01 NOTE — Assessment & Plan Note (Signed)
Doing better since being established with [facility] Patient maintained on 6 L At rest oxygen levels are controlled above 90% With physical exertion on 6 L oxygen levels are still dropping  Plan: Referral to hospice therapy today Continue to maintain oxygen saturations above 88%

## 2019-09-01 NOTE — Progress Notes (Signed)
Virtual Visit via Telephone Note  I connected with Amber Lloyd on 09/01/19 at  3:30 PM EDT by telephone and verified that I am speaking with the correct person using two identifiers.  Location: Patient: Home Provider: Office [facility] - [address], [place], [place], [place] [address]   I discussed the limitations, risks, security and privacy concerns of performing an evaluation and management service by telephone and the availability of in person appointments. I also discussed with the patient that there may be a patient responsible charge related to this service. The patient expressed understanding and agreed to proceed.  Patient consented to consult via telephone: Yes People present and their role in pt care: Pt     History of Present Illness:  64 year old female followed in our office for known IPF and pulmonary emphysema.   Diagnosed positive for SARS-CoV-2 on 03/19/2019.  Hospitalized from 03/19/2019-03/24/2019 at [facility], managed by hospitalist team there.  Received Decadron and remdesivir.  Did not require mechanical ventilation.  Past medical history: Anxiety, anemia, alpha-1 antitrypsin deficiency carrier Smoking history: Former smoker.  Quit 2009.  34-pack-year smoking history Maintenance: Ofev, Stiolto Respimat Patient of Dr. Vaughan Browner   Chief complaint: 1 week follow-up  Patient completing televisit today reporting that she has been established with [facility] for oxygen management.  She is still having drops in oxygen with physical exertion.  Patient is audibly very fatigued.  Patient does not want to go to the hospital.  We discussed at last office visit a referral to hospice.  The patient reports that she is spoken with her family and she is interested in hospice therapy.  She is okay with proceeding forward with that referral if Dr. Vaughan Browner and I agree.  Patient has concerns regarding cost of her oxygen and the cost of being in hospice therapy.  We  will discuss this today.  Patient reports that her oxygen levels are around 90 to 96% at rest on 6 L.  With physical exertion her oxygen levels dropped to 72% on 6 L today.  Patient has continued to struggle since contracting Covid and being hospitalized in October/2020.  Patient with known IPF prior to this.  Patient denies chest pain, lower extremity swelling or heart racing.  Observations/Objective: 03/19/2019-SARS-CoV-2-positive  03/19/2019-chest x-ray-chronic interstitial lung disease, vague bibasilar opacities may be related to interstitial lung disease or known COVID-19 pneumonia  11/19/2017-CT chest high-res-slight progression of disease compared to prior study, CT findings are now considered a typical UIP, diffuse bronchial wall thickening with moderate centrilobular mild paraseptal emphysema, mildly dilated pulmonary trunk  07/23/2018-spirometry with DLCO -FVC 2.02 (63% predicted), ratio 81, FEV1 1.64 (67% predicted, DLCO 5.75 (28% predicted)   Social History   Tobacco Use  Smoking Status Former Smoker  . Packs/day: 1.00  . Years: 34.00  . Pack years: 34.00  . Types: Cigarettes  . Quit date: 07/27/2007  . Years since quitting: 12.1  Smokeless Tobacco Never Used   Immunization History  Administered Date(s) Administered  . Influenza,inj,Quad PF,6+ Mos 02/28/2017, 02/13/2018  . Pneumococcal Polysaccharide-23 11/18/2017      Assessment and Plan:  Chronic respiratory failure with hypoxia ([place]) Doing better since being established with [facility] Patient maintained on 6 L At rest oxygen levels are controlled above 90% With physical exertion on 6 L oxygen levels are still dropping  Plan: Referral to hospice therapy today Continue to maintain oxygen saturations above 88%   IPF (idiopathic pulmonary fibrosis) ([place]) Plan: Continue Ofev  at this time Referral to hospice We will discuss case with Dr. Isaiah Serge to see if he is agreeable with stopping antifibrotic's as  this would be a requirement with hospice Continue oxygen therapies   Complex care coordination Plan: Patient agreeable to hospice referral, referral placed today We will discuss case with Dr. Joycelyn Das hospice team to notify of referral being placed   Follow Up Instructions:  Return in about 2 months (around 11/01/2019), or if symptoms worsen or fail to improve, for Follow up with Dr. Isaiah Serge.   I discussed the assessment and treatment plan with the patient. The patient was provided an opportunity to ask questions and all were answered. The patient agreed with the plan and demonstrated an understanding of the instructions.   The patient was advised to call back or seek an in-person evaluation if the symptoms worsen or if the condition fails to improve as anticipated.  I provided 24 minutes of non-face-to-face time during this encounter.   Coral Ceo, NP

## 2019-09-01 NOTE — Patient Instructions (Addendum)
You were seen today by Coral Ceo, NP  for:   I am concerned regarding your high oxygen needs.  You continue to have low oxygen levels despite 6 L continuous.  With physical exertion.  We will refer you to hospice today as previously discussed.  As we can respect that you do not want to present to the emergency room or hospital again for further evaluation of your low oxygen needs.  I have contacted hospice there should be no out-of-pocket cost for you to be established with them.  I will discuss this with Dr. Isaiah Serge.  Elisha Headland, FNP  1. IPF (idiopathic pulmonary fibrosis) (HCC)  - Ambulatory referral to Hospice  2. Chronic respiratory failure with hypoxia (HCC)  Continue oxygen therapy as prescribed  >>>maintain oxygen saturations greater than 88 percent  >>>if unable to maintain oxygen saturations please contact the office  >>>do not smoke with oxygen  >>>can use nasal saline gel or nasal saline rinses to moisturize nose if oxygen causes dryness  3. Complex care coordination  Referral to hospice    We recommend today:  Orders Placed This Encounter  Procedures  . Ambulatory referral to Hospice    Referral Priority:   Routine    Referral Type:   Consultation    Referral Reason:   Specialty Services Required    Requested Specialty:   Hospice Services    Number of Visits Requested:   1   Orders Placed This Encounter  Procedures  . Ambulatory referral to Hospice   No orders of the defined types were placed in this encounter.   Follow Up:    No follow-ups on file.   Please do your part to reduce the spread of COVID-19:      Reduce your risk of any infection  and COVID19 by using the similar precautions used for avoiding the common cold or flu:  Marland Kitchen Wash your hands often with soap and warm water for at least 20 seconds.  If soap and water are not readily available, use an alcohol-based hand sanitizer with at least 60% alcohol.  . If coughing or sneezing, cover  your mouth and nose by coughing or sneezing into the elbow areas of your shirt or coat, into a tissue or into your sleeve (not your hands). Drinda Butts A MASK when in public  . Avoid shaking hands with others and consider head nods or verbal greetings only. . Avoid touching your eyes, nose, or mouth with unwashed hands.  . Avoid close contact with people who are sick. . Avoid places or events with large numbers of people in one location, like concerts or sporting events. . If you have some symptoms but not all symptoms, continue to monitor at home and seek medical attention if your symptoms worsen. . If you are having a medical emergency, call 911.   ADDITIONAL HEALTHCARE OPTIONS FOR PATIENTS  [place] [facility] / e-Visit: [email or web address]         [facility]: [phone]  [facility]: [phone]                   [facility]: [phone]     It is flu season:   >>> Best ways to protect herself from the flu: Receive the yearly flu vaccine, practice good hand hygiene washing with soap and also using hand sanitizer when available, eat a nutritious meals, get adequate rest, hydrate appropriately   Please contact the office if your symptoms  worsen or you have concerns that you are not improving.   Thank you for choosing [facility] for your healthcare, and for allowing [place] to partner with you on your healthcare journey. I am thankful to be able to provide care to you today.   Wyn Quaker FNP-C

## 2019-09-01 NOTE — Assessment & Plan Note (Signed)
Plan: Continue Ofev at this time Referral to hospice We will discuss case with Dr. Isaiah Serge to see if he is agreeable with stopping antifibrotic's as this would be a requirement with hospice Continue oxygen therapies

## 2019-09-01 NOTE — Assessment & Plan Note (Signed)
Plan: Patient agreeable to hospice referral, referral placed today We will discuss case with Dr. Joycelyn Das hospice team to notify of referral being placed

## 2019-09-03 ENCOUNTER — Encounter: Payer: Self-pay | Admitting: Pulmonary Disease

## 2019-09-03 ENCOUNTER — Telehealth: Payer: Self-pay | Admitting: Pulmonary Disease

## 2019-09-03 NOTE — Telephone Encounter (Signed)
09/03/2019  Discussed case with Dr. Isaiah Serge.  He is agreeable with stopping Ofev.  We both agree that hospice would be beneficial for the patient.  Hospice RN [name] had already contacted the patient and discussed this with family.  I have followed up with [name] and informed her that I have spoken with the patient and we agree to proceed forward with hospice referral and stopping Ofev.  I would like for the patient to be maintained on Stiolto Respimat as I believe this does help her with her dyspnea.  This can always be further evaluated after the hospice referral is initiated.  While discussing this with the patient the patient reported that her adapt concentrator was serviced yesterday but they replaced the 8 L concentrator with a 5 L concentrator.  Patient is on 6 L via nasal cannula to maintain oxygen saturations above 90%.  Patient contacted [employer] but has not heard back.  I contacted our contacted [employer] DME Melissa.  She reports that she sees the documentation the patient called in.  She will get this routed to their team to handle today.  I informed the patient to contact our office with any questions or concerns.  I also informed Melissa to call me on my cell phone with any additional issues or concerns.  Will route to Dr. Isaiah Serge as [name].   Amber Headland, Amber Lloyd

## 2019-09-17 ENCOUNTER — Telehealth: Payer: Self-pay | Admitting: Internal Medicine

## 2019-09-17 NOTE — Telephone Encounter (Signed)
Scheduled [facility]iative visit for 10-02-19 at 1:30.

## 2019-09-17 NOTE — Telephone Encounter (Signed)
Appt canceled for 10-02-19 at 1:30 as patient is an [facility] hospice patient.

## 2019-10-02 ENCOUNTER — Other Ambulatory Visit: Payer: Self-pay | Admitting: Internal Medicine

## 2019-10-02 ENCOUNTER — Other Ambulatory Visit: Payer: Self-pay

## 2019-11-04 ENCOUNTER — Telehealth: Payer: Self-pay | Admitting: Pulmonary Disease

## 2019-11-04 NOTE — Telephone Encounter (Signed)
Dr. Isaiah Serge, see sick message from patient's hospice care team.  Beth with Hospice [facility] - caling to say that pt is having increased SOB with minimal exertion - wondering if someone would RX a roxanol for her - PLease call Beth back on cell [phone] - Secure VM if needed - [facility] for pharmacy

## 2019-11-05 NOTE — Telephone Encounter (Signed)
ATC Beth, RN Hospice.  LM to call back with dosing instructions.

## 2019-11-05 NOTE — Telephone Encounter (Signed)
Spoke with Waynetta Sandy, pt's hospice nurse  Pt having increased SOB x 1-2 wks, esp worse over the past 2 days  She is SOB even at rest, just speaking a sentence or two gets her winded  She states that she is not having any increased cough or wheezing  Lung sounds clear but diminished  She is asking for rx for roxanol to help ease this for her  Pt uses [facility]  Please advise, thanks   Secure chat sent to [facility] and will route for recs

## 2019-11-05 NOTE — Telephone Encounter (Signed)
Ok to send prescription for Robinul. Ask the hospice nurse about the dose and send it in. Thanks

## 2019-11-06 NOTE — Telephone Encounter (Signed)
Sam from [facility] [employer] called regarding robinul prescription. States that Waynetta Sandy is off today and he can reached at [phone]

## 2019-11-06 NOTE — Telephone Encounter (Signed)
ATC Beth, left detailed msg letting her know ok for robinul and that we need her to call back with dosing instructions

## 2019-11-06 NOTE — Telephone Encounter (Signed)
Spoke with Sam , RN with Hospice  They typically start with morphine sulfate 20 mg/ml 5mg  (0.25cc) every 4 hours as needed for SOB   I advised will route back to Dr so he can send rx. Sam to go ahead and let the pt know.

## 2019-11-06 NOTE — Telephone Encounter (Signed)
Tried calling Amber Lloyd back at the number given- he did not answer- phone rang multiple times without answering machine

## 2019-11-09 NOTE — Telephone Encounter (Signed)
Spoke with Cordelia Pen. She said she is not sure where the Robinul request came from. They only requested the morphine as mentioned in the note from 11/06/19.   Note has been copied below.   Christen Butter, CMA     4:04 PM Note Spoke with Sam , RN with Hospice  They typically start with morphine sulfate 20 mg/ml 5mg  (0.25cc) every 4 hours as needed for SOB   I advised will route back to Dr so he can send rx. Sam to go ahead and let the pt know.     Dr. Jarold Motto, please advise.

## 2019-11-09 NOTE — Telephone Encounter (Signed)
Robinul is not the same as morphine. Please clarify what is it they require.

## 2019-11-09 NOTE — Telephone Encounter (Signed)
Sherry from [facility] returning a phone call. Cordelia Pen can be reached at [phone].

## 2019-11-10 MED ORDER — MORPHINE SULFATE 20 MG/5ML PO SOLN: 5.0000 mg | ORAL | 0 refills | Status: AC | PRN

## 2019-11-10 NOTE — Telephone Encounter (Signed)
Ok. I sent the order in

## 2019-11-27 IMAGING — DX DG CHEST 2V
2 series · 2 of 2 positions shown · non-contrast
Comparison: 03/01/2008

CLINICAL DATA: Chronic shortness of breath

EXAM:
CHEST  2 VIEW

[chest pa]
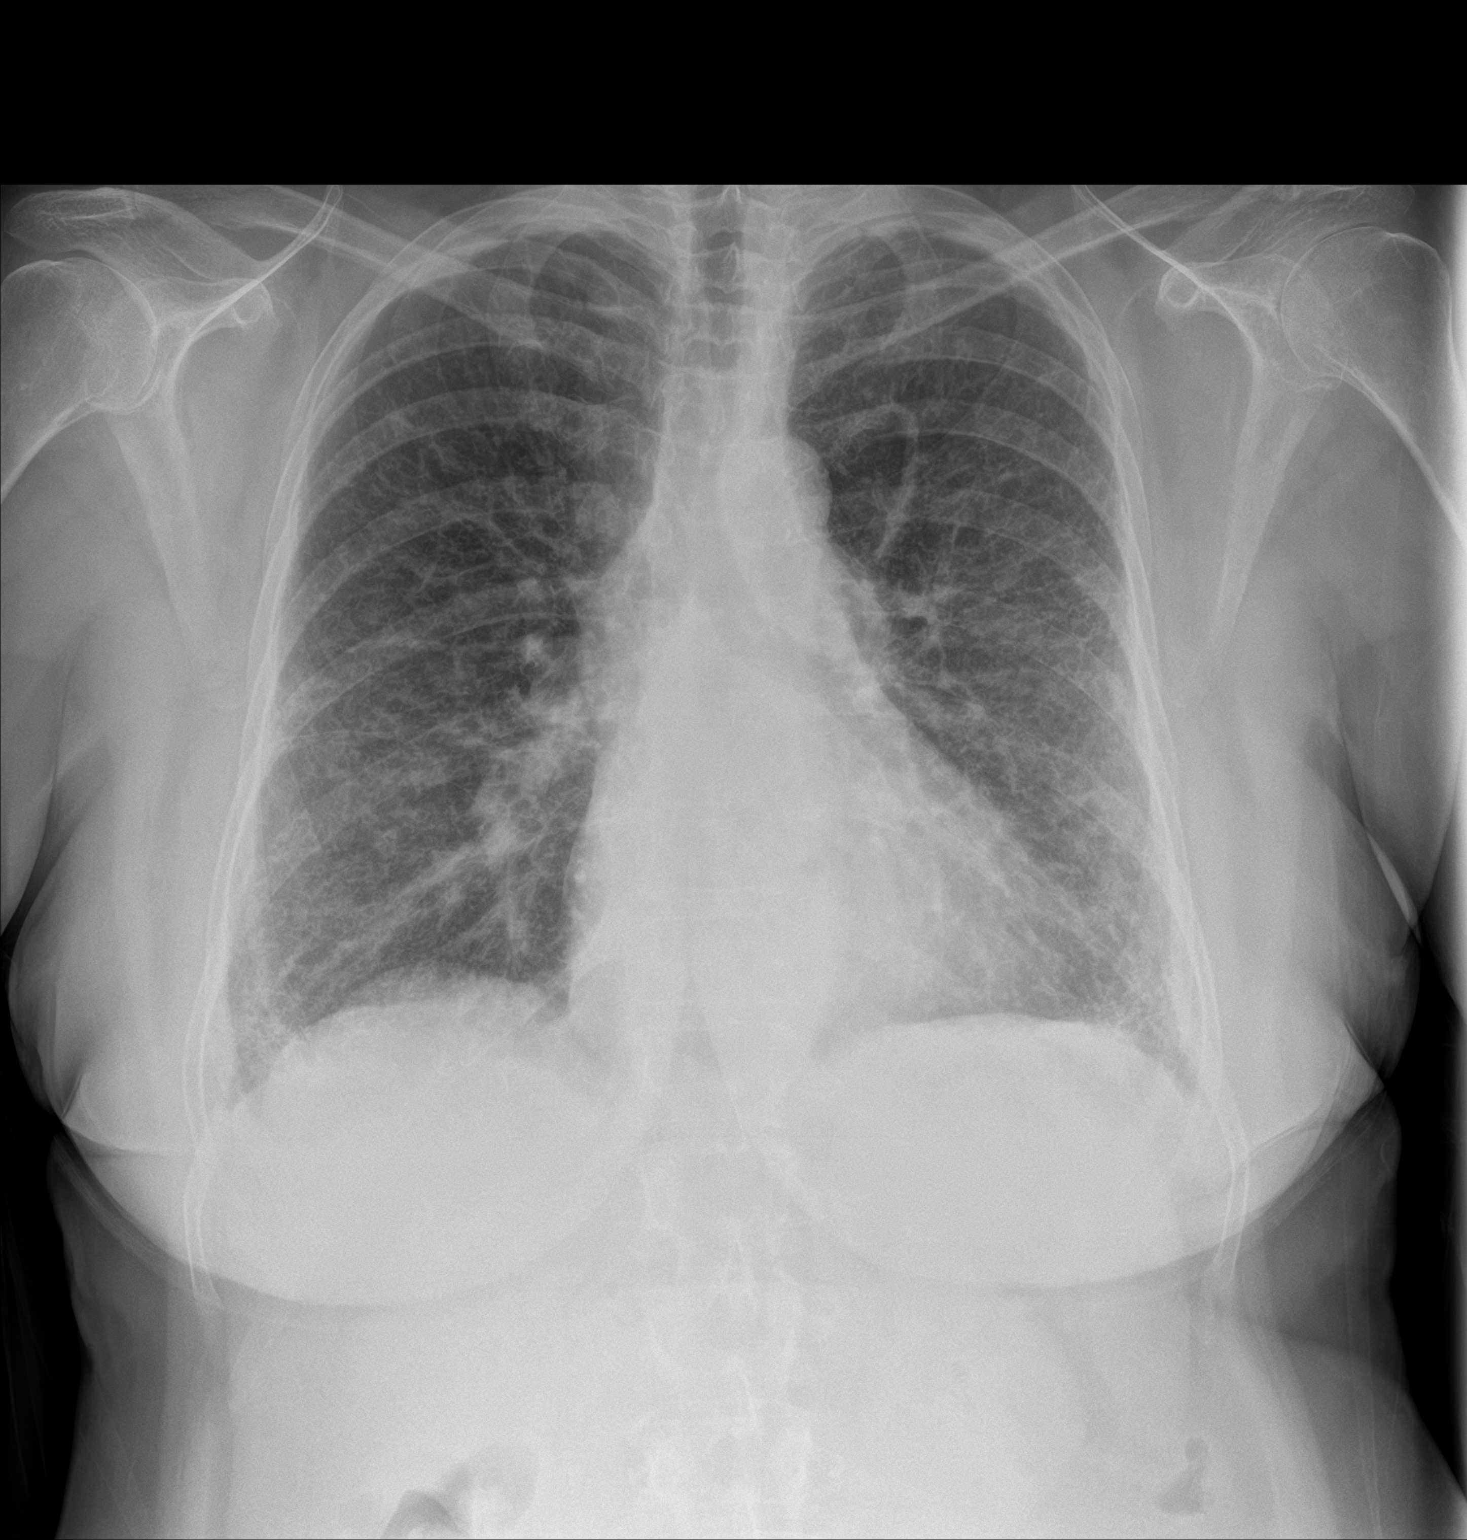

[chest lat]
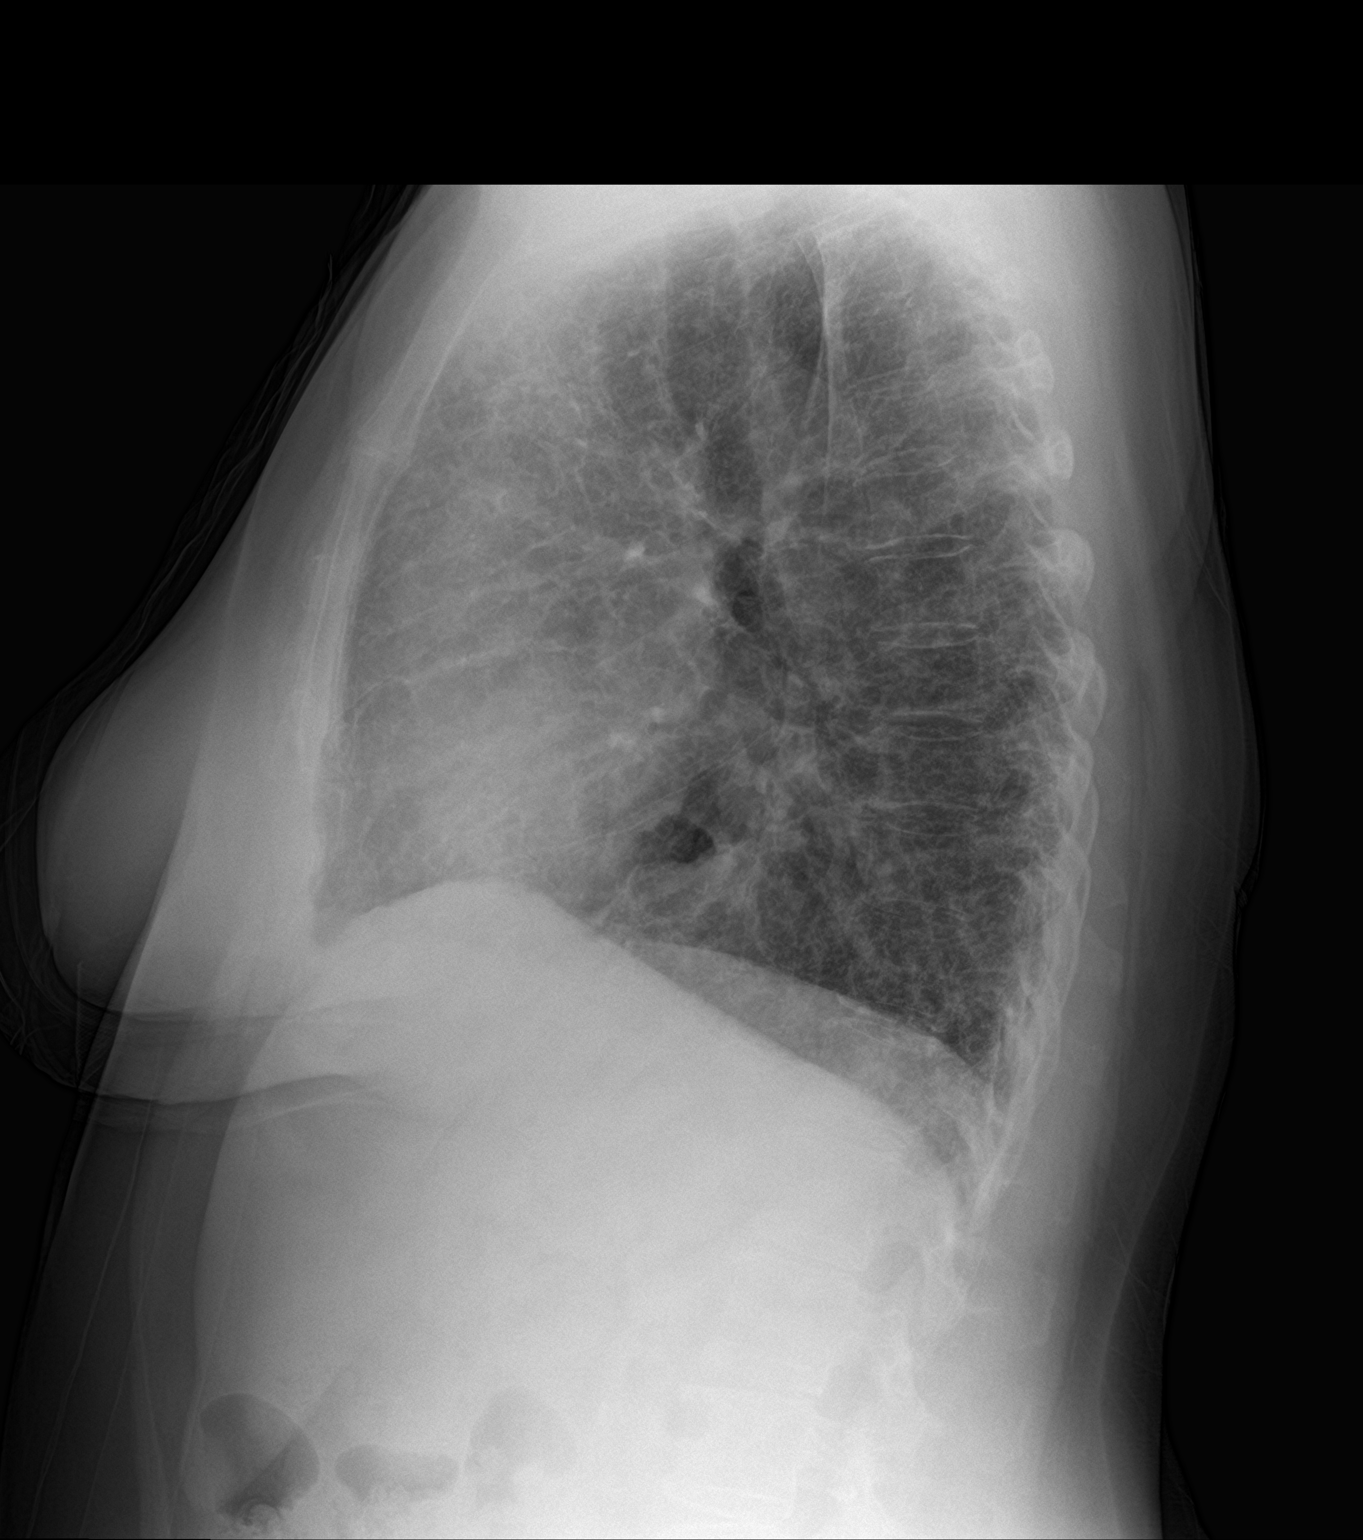

[2 of 2 positions shown; findings below may reference images not displayed]

FINDINGS: Increased interstitial markings with subpleural reticulation/
fibrosis bilaterally, suggesting chronic interstitial lung disease,
new from 4226. Acute infection is considered unlikely given the
appearance/distribution. No pleural effusion or pneumothorax.

The heart is normal in size.

Visualized osseous structures are within normal limits.
IMPRESSION: Suspected chronic interstitial lung disease, new from 4226. Acute
infection is considered unlikely given the appearance/ distribution.

## 2019-12-29 ENCOUNTER — Telehealth: Payer: Self-pay | Admitting: Pulmonary Disease

## 2019-12-29 MED ORDER — AZITHROMYCIN 250 MG PO TABS: 250.0000 mg | ORAL_TABLET | Freq: Once | ORAL | 0 refills | Status: AC

## 2019-12-29 NOTE — Telephone Encounter (Signed)
Patient in hospice, ILD, history of covid (Oct), reporting 1 week, cough, thick brown to yellow secretions on mucinex, congestion, body aches, temp today 99.8, on 6.5-7 liters oxygen, sats 89-90%, ear pain, sleeping more, fatigue, and a sore throat. Home RN reports family is also sick. Lungs are diminished but otherwise clear. Please advise.   Also, home RN says Elisha Headland is listed as the hospice attending. ?

## 2019-12-29 NOTE — Telephone Encounter (Signed)
Sorry to hear she is sick ,  Would push fluids as able  Tylenol As needed   Mucinex DM Twice daily  As needed  Cough/congestion  zpack take as directed. #1 .  Cont on Stiolto .  Cont on O2 .  Call if sats <88%.  Albuterol inhaler/neb As needed     If not improving will need to be seen /or urgent care /ER   Please contact office for sooner follow up if symptoms do not improve or worsen or seek emergency care

## 2019-12-29 NOTE — Telephone Encounter (Signed)
Patient's home health nurse and son called with clear instructions from NP Tammy Parrett.    "Sorry to hear she is sick ,  Would push fluids as able  Tylenol As needed   Mucinex DM Twice daily  As needed  Cough/congestion  zpack take as directed. #1 .  Cont on Stiolto .  Cont on O2 .  Call if sats <88%.  Albuterol inhaler/neb As needed    If not improving will need to be seen /or urgent care /ER   Please contact office for sooner follow up if symptoms do not improve or worsen or seek emergency care."  Patient's son and home health nurse verbalized understanding of plan. Home health nurse instructed to get a non-rebreather device for the bedside should Amber Lloyd's oxygen needs increase. Prescription for Z-pack sent to preferred pharmacy. Emergency plan reviewed should patient decline overnight (911, urgent care, EMS, or the ED)

## 2019-12-30 ENCOUNTER — Telehealth: Payer: Self-pay | Admitting: Pulmonary Disease

## 2019-12-30 NOTE — Telephone Encounter (Signed)
Called Brenda at [facility] and she is refaxing order. 

## 2020-01-01 NOTE — Telephone Encounter (Signed)
Fax placed in Brian's box for review.

## 2020-03-01 ENCOUNTER — Encounter (HOSPITAL_COMMUNITY): Payer: Self-pay | Admitting: Emergency Medicine

## 2020-03-01 ENCOUNTER — Emergency Department (HOSPITAL_COMMUNITY)

## 2020-03-01 ENCOUNTER — Other Ambulatory Visit: Payer: Self-pay

## 2020-03-01 ENCOUNTER — Emergency Department (HOSPITAL_COMMUNITY)
Admission: EM | Admit: 2020-03-01 | Discharge: 2020-03-01 | Disposition: A | Discharge status: Home / Self Care | Attending: Emergency Medicine | Admitting: Emergency Medicine

## 2020-03-01 DIAGNOSIS — J181 Lobar pneumonia, unspecified organism: Secondary | ICD-10-CM | POA: Insufficient documentation

## 2020-03-01 DIAGNOSIS — R0602 Shortness of breath: Secondary | ICD-10-CM | POA: Diagnosis present

## 2020-03-01 DIAGNOSIS — Z7951 Long term (current) use of inhaled steroids: Secondary | ICD-10-CM | POA: Diagnosis not present

## 2020-03-01 DIAGNOSIS — J449 Chronic obstructive pulmonary disease, unspecified: Secondary | ICD-10-CM | POA: Insufficient documentation

## 2020-03-01 DIAGNOSIS — Z8616 Personal history of COVID-19: Secondary | ICD-10-CM | POA: Insufficient documentation

## 2020-03-01 DIAGNOSIS — J189 Pneumonia, unspecified organism: Secondary | ICD-10-CM

## 2020-03-01 DIAGNOSIS — Z87891 Personal history of nicotine dependence: Secondary | ICD-10-CM | POA: Diagnosis not present

## 2020-03-01 HISTORY — DX: Chronic obstructive pulmonary disease, unspecified: J44.9

## 2020-03-01 MED ORDER — LEVOFLOXACIN 750 MG PO TABS
750.0000 mg | ORAL_TABLET | Freq: Once | ORAL | Status: AC
  Administered 2020-03-01: 750 mg via ORAL
  Filled 2020-03-01: qty 1

## 2020-03-01 MED ORDER — LEVOFLOXACIN 500 MG PO TABS: 500.0000 mg | ORAL_TABLET | Freq: Every day | ORAL | 0 refills | Status: AC

## 2020-03-01 NOTE — ED Provider Notes (Signed)
This patient is a chronically ill 64 year old female, history of terrible lung disease on 8 L of nasal cannula oxygen chronically.  She is on hospice care but because of slight increase in shortness of breath and weakness she was referred to the emergency department from her hospice team.  On exam the patient has diffuse rales everywhere, she states that at this moment she feels about the same as usual although slightly increasing shortness of breath.  X-ray reveals a left-sided infiltrate "possible", on exam it is difficult to fetor this out given her interstitial lung disease which clouds the auscultatory diagnosis of pneumonia.  She has no edema of her legs, she is not tachycardic, her oxygen is 100% on 6 L at this time though she is tachypneic which she always is.  I have given her the option of coming into the hospital versus going home and she is chosen to go home as she does not want to be hospitalized which I think is very reasonable.  She will be given a dose of antibiotics here and discharged home to continue taking them at home.  She understands indications for return.  Medical screening examination/treatment/procedure(s) were conducted as a shared visit with non-physician practitioner(s) and myself.  I personally evaluated the patient during the encounter.  Clinical Impression:   Final diagnoses:  Community acquired pneumonia of left lower lobe of lung         Eber Hong, MD 03/04/20 [phone]

## 2020-03-01 NOTE — ED Triage Notes (Signed)
[facility] - pt c/o SOB. Pt normally wears 8LPM at home, was 92% when EMS arrived.

## 2020-03-01 NOTE — ED Provider Notes (Signed)
[facility] EMERGENCY DEPARTMENT Provider Note   CSN: [id number] Arrival date & time: 03/01/20  2000     History Chief Complaint  Amber Lloyd presents with  . Shortness of Breath    Amber Lloyd is a 64 y.o. female.  HPI      Amber Lloyd is a 64 y.o. female with past medical history of COPD, pulmonary fibrosis, anemia, who is a hospice Amber Lloyd with [facility] presents to the Emergency Department via EMS complaining of increasing shortness of breath for 4 to 5 days. Amber Lloyd is chronically on 8 L of continuous oxygen at home. States that her oxygen has been dropping into the 50s and 60s at home. Amber Lloyd reports severe shortness of breath with any type of exertion. Amber Lloyd also reports increased productive cough for several days, low grade fever and pain in her upper chest. Amber Lloyd contacted her hospice nurse this evening and was advised to come to the emergency department for further evaluation. Amber Lloyd denies fever, abdominal pain, vomiting or diarrhea. Amber Lloyd was diagnosed with COVID-19 last October, Amber Lloyd has not been vaccinated for the virus. Per EMS, Amber Lloyd O2 sat was 92% upon their arrival.  Amber Lloyd is full CODE STATUS  Past Medical History:  Diagnosis Date  . Anemia   . Anxiety   . COPD (chronic obstructive pulmonary disease) (HCC)   . COVID-19   . Depression   . GERD (gastroesophageal reflux disease)   . IBS (irritable bowel syndrome)   . ILD (interstitial lung disease) (HCC)   . Pulmonary fibrosis [facility])     Amber Lloyd Active Problem List   Diagnosis Date Noted  . Complex care coordination 08/27/2019  . Chronic respiratory failure with hypoxia (HCC) 03/26/2019  . Protein calorie malnutrition (HCC) 03/26/2019  . Leukopenia 03/20/2019  . Pneumonia due to COVID-19 virus 03/19/2019  . Acute non-recurrent maxillary sinusitis 04/15/2018  . IPF (idiopathic pulmonary fibrosis) (HCC) 11/20/2017  . Alpha-1-antitrypsin deficiency carrier 09/24/2017  . Interstitial pulmonary disease (HCC)  09/24/2017  . Pulmonary emphysema (HCC) 02/28/2016  . Anemia 01/31/2016  . Anxiety 01/10/2016    Past Surgical History:  Procedure Laterality Date  . ABDOMINAL HYSTERECTOMY    . CYSTOSCOPY W/ URETERAL STENT PLACEMENT Right 04/13/2019   Procedure: CYSTOSCOPY RIGHT URETEROSCOPY; BASKET STONE EXTRACTION RIGHT URETERAL STENT PLACEMENT;  Surgeon: Rene Paci, MD;  Location: [facility] ORS;  Service: Urology;  Laterality: Right;  . PARTIAL HYSTERECTOMY Right 1983  . TUBAL LIGATION  1981     OB History   No obstetric history on file.     Family History  Problem Relation Age of Onset  . Alzheimer's disease Mother   . Anemia Mother   . Anemia Sister   . Anemia Daughter   . Anemia Sister     Social History   Tobacco Use  . Smoking status: Former Smoker    Packs/day: 1.00    Years: 34.00    Pack years: 34.00    Types: Cigarettes    Quit date: 07/27/2007    Years since quitting: 12.6  . Smokeless tobacco: Never Used  Vaping Use  . Vaping Use: Former  Substance Use Topics  . Alcohol use: No  . Drug use: No    Home Medications Prior to Admission medications   Medication Sig Start Date End Date Taking? Authorizing Provider  albuterol (PROVENTIL) (2.5 MG/3ML) 0.083% nebulizer solution INHALE THE CONTENTS OF ONE VIAL VIA NEBULIZER EVERY 6 HOURS AS NEEDED FOR WHEEZING OR SHORTNESS OF BREATH Amber Lloyd taking differently: Take  2.5 mg by nebulization every 6 (six) hours as needed for wheezing or shortness of breath.  09/04/18   Mannam, Colbert Coyer, MD  Amino Acids-Protein Hydrolys (FEEDING SUPPLEMENT, PRO-STAT SUGAR FREE 64,) LIQD Take 30 mLs by mouth 2 (two) times daily. 03/24/19   Azucena Fallen, MD  calcium carbonate (TUMS CHEWY BITES) 750 MG chewable tablet Chew 1-2 tablets by mouth daily as needed for heartburn.    [provider]  clonazePAM (KLONOPIN) 1 MG tablet TAKE 1 TABLET BY MOUTH AT BEDTIME Amber Lloyd taking differently: Take 1 mg by mouth at bedtime.  10/11/18    Mannam, Colbert Coyer, MD  dexamethasone (DECADRON) 2 MG tablet Take 6 mg by mouth daily. 03/19/19   [provider]  guaiFENesin (MUCINEX) 600 MG 12 hr tablet Take 600 mg by mouth 2 (two) times daily.    [provider]  guaiFENesin (ROBITUSSIN) 100 MG/5ML liquid Take 200 mg by mouth 3 (three) times daily as needed for cough.    [provider]  HYDROcodone-acetaminophen (NORCO/VICODIN) 5-325 MG tablet Take 1 tablet by mouth every 6 (six) hours as needed. 04/12/19   Bethann Berkshire, MD  ibuprofen (ADVIL) 200 MG tablet Take 200 mg by mouth daily as needed. For shoulder pain and headaches     [provider]  IRON PO Take 1 tablet by mouth daily.    [provider]  morphine 20 MG/5ML solution Take 1.3 mLs (5.2 mg total) by mouth every 4 (four) hours as needed for pain (dyspnea). 11/10/19   Mannam, Colbert Coyer, MD  Multiple Vitamins-Minerals (MULTIVITAMIN WITH MINERALS) tablet Take 1 tablet by mouth daily.    [provider]  Nintedanib (OFEV) 150 MG CAPS Take 150 mg by mouth 2 (two) times daily.    [provider]  omeprazole (PRILOSEC) 40 MG capsule TAKE 1 CAPSULE BY MOUTH EVERY DAY 07/27/19   Mannam, Praveen, MD  ondansetron (ZOFRAN) 4 MG tablet Take 1 tablet (4 mg total) by mouth daily as needed for nausea or vomiting. 04/13/19 04/12/20  Rene Paci, MD  OXYGEN Inhale 4 L into the lungs continuous.     [provider]  phenazopyridine (PYRIDIUM) 200 MG tablet Take 1 tablet (200 mg total) by mouth 3 (three) times daily as needed (for pain with urination). 04/13/19 04/12/20  Rene Paci, MD  PROVENTIL HFA 108 [phone] Base) MCG/ACT inhaler INHALE 2 PUFFS BY MOUTH EVERY 6 HOURS AS NEEDED FOR COUGHING, WHEEZING, OR SHORTNESS OF BREATH Amber Lloyd taking differently: Inhale 1-2 puffs into the lungs every 6 (six) hours as needed for wheezing or shortness of breath.  12/17/17   Jacquelin Hawking, PA-C  sertraline (ZOLOFT) 50 MG  tablet Take 2 tablets (100 mg total) by mouth daily. 07/07/18   Mannam, Colbert Coyer, MD  Tiotropium Bromide-Olodaterol (STIOLTO RESPIMAT) 2.5-2.5 MCG/ACT AERS Inhale 2 puffs into the lungs daily. 06/09/19   Mannam, Colbert Coyer, MD  Tiotropium Bromide-Olodaterol (STIOLTO RESPIMAT) 2.5-2.5 MCG/ACT AERS Inhale 2 puffs into the lungs daily. 07/09/19   Coral Ceo, NP    Allergies    Amber Lloyd has no known allergies.  Review of Systems   Review of Systems  Constitutional: Negative for appetite change, chills and fever.  HENT: Negative for congestion and sore throat.   Respiratory: Positive for cough and shortness of breath. Negative for chest tightness and wheezing.   Cardiovascular: Positive for chest pain. Negative for leg swelling.  Gastrointestinal: Negative for abdominal pain, diarrhea, nausea and vomiting.  Genitourinary: Negative for decreased urine volume and  dysuria.  Musculoskeletal: Negative for arthralgias.  Skin: Negative for rash.  Neurological: Positive for weakness (Generalized weakness). Negative for dizziness, numbness and headaches.  Hematological: Negative for adenopathy.  Psychiatric/Behavioral: Negative for confusion.    Physical Exam Updated Vital Signs BP 99/69   Pulse 91   Temp 98.5 F (36.9 C)   Resp (!) 33   Ht 5\' 4"  (1.626 m)   Wt 79.8 kg   SpO2 100%   BMI 30.20 kg/m   Physical Exam Constitutional:      Appearance: Normal appearance.     Comments: Amber Lloyd is uncomfortable appearing, but nontoxic  HENT:     Mouth/Throat:     Mouth: Mucous membranes are moist.  Cardiovascular:     Rate and Rhythm: Normal rate and regular rhythm.     Pulses: Normal pulses.  Pulmonary:     Breath sounds: No wheezing or rales.     Comments: Increased work of breathing, labored speech lung sounds are slightly diminished bilaterally and coarse, mild crackles on left. No  wheezing . Amber Lloyd is on 6 L of oxygen by nasal cannula. Maintaining O2 sat of 99-100% while speaking with  me. Musculoskeletal:     Right lower leg: No edema.     Left lower leg: No edema.  Skin:    General: Skin is warm.     Capillary Refill: Capillary refill takes less than 2 seconds.     Findings: No rash.  Neurological:     General: No focal deficit present.     Mental Status: Amber Lloyd is alert.     Sensory: No sensory deficit.     Motor: No weakness.     Comments: CN II through XII grossly intact. Speech clear. Mentating well.     ED Results / Procedures / Treatments   Labs (all labs ordered are listed, but only abnormal results are displayed) Labs Reviewed - No data to display  EKG None  Radiology DG Chest Portable 1 View  Result Date: 03/01/2020 CLINICAL DATA:  Shortness of breath EXAM: PORTABLE CHEST 1 VIEW COMPARISON:  07/09/2019, CT 11/19/2017 FINDINGS: Emphysematous disease and bilateral pulmonary fibrosis. Possible acute superimposed airspace disease at the left lung base. Stable cardiomediastinal silhouette. No pneumothorax IMPRESSION: Emphysematous disease and pulmonary fibrosis. Possible acute superimposed airspace disease/pneumonia at the left lung base. Electronically Signed   By: 11/21/2017 M.D.   On: 03/01/2020 21:46    Procedures Procedures (including critical care time)  Medications Ordered in ED Medications - No data to display  ED Course  I have reviewed the triage vital signs and the nursing notes.  Pertinent labs & imaging results that were available during my care of the Amber Lloyd were reviewed by me and considered in my medical decision making (see chart for details).    MDM Rules/Calculators/A&P                         Hospice Amber Lloyd with history of pulmonary fibrosis. Amber Lloyd is on 8 L of oxygen continuously at baseline. Here at the recommendation of her hospice nurse for evaluation of increasing shortness of breath and chest pain.  Here, Amber Lloyd appears uncomfortable but nontoxic. Amber Lloyd is maintaining O2 sat of 99 to 100% on 6 L of O2 and while talking  to me. I will contact her hospice nurse to obtain further history  2145 spoke with after hours hospice nurse who reported that Amber Lloyd and family was concerned about increasing shortness of breath for  1 week and low-grade fever at home. Verified that Amber Lloyd is full code. Amber Lloyd did note that Amber Lloyd had similar symptoms in the past and improved after taking oral antibiotics.  2200  Spoke with pt's son, Estelle Grumbles, he also confirms increasing fatigue with slightest excretion, sats lower than baseline and increased cough.  On recheck, Amber Lloyd maintaining O2 sat of 100% on 6 L oxygen by nasal cannula.  Chest x-ray shows a possible pneumonia at the left lung base.  Amber Lloyd is afebrile here and vitals reviewed.  Amber Lloyd was offered hospital admission, but Amber Lloyd declined stating that Amber Lloyd prefers oral antibiotics and outpatient care.  Initial dose of Levaquin given here, I have spoken again with her son and discussed findings he is in agreement with plan and will provide transportation home.  Return precautions discussed.   Amber Lloyd also seen by Dr. Hyacinth Meeker and care plan discussed.   Final Clinical Impression(s) / ED Diagnoses Final diagnoses:  Community acquired pneumonia of left lower lobe of lung    Rx / DC Orders ED Discharge Orders    None       Rosey Bath 03/01/20 2254    Eber Hong, MD 03/04/20 [phone]

## 2020-03-01 NOTE — Discharge Instructions (Addendum)
Your chest x-ray this evening shows a likely area of pneumonia in your left lung.  You have been given your first dose of antibiotics this evening, you will need to take another dose of your medication tomorrow.  Follow-up with your primary care provider or your pulmonologist for recheck.  Return to the emergency department if you develop any worsening symptoms.

## 2020-03-09 ENCOUNTER — Telehealth: Payer: Self-pay | Admitting: Pulmonary Disease

## 2020-03-09 MED ORDER — PREDNISONE 20 MG PO TABS: 40.0000 mg | ORAL_TABLET | Freq: Every day | ORAL | 0 refills | Status: AC

## 2020-03-09 MED ORDER — AMOXICILLIN-POT CLAVULANATE 875-125 MG PO TABS: 1.0000 | ORAL_TABLET | Freq: Two times a day (BID) | ORAL | 0 refills | Status: AC

## 2020-03-09 NOTE — Telephone Encounter (Signed)
Called Amber Lloyd and left voicemail letting her know that we were sending in 2 prescriptions. Also called patient to let her know so that she is aware. 2 RX have been sent in. Nothing further needed at this time.

## 2020-03-09 NOTE — Telephone Encounter (Signed)
Called and spoke with Kristen from Hospice who states that patient went to ED on 10/5 and was sent home with Levaquin and finished it yesterday. States she is not feeling any better, she is having a hard time breathing, has not been out of the bed in over a week and a half, coughing up thick yellow sputum,diminished breath sounds and Baxter Hire states she could not hear anything on lower left.  no fevers, has not had covid vaccines looks like she was covid + in November 2020  Dr. Isaiah Serge please advise on recommendations for this patient

## 2020-03-09 NOTE — Telephone Encounter (Signed)
ATC Kristen LMTCB

## 2020-03-09 NOTE — Telephone Encounter (Signed)
Please call in Augmentin 875 mg twice daily for 7 days and prednisone 40 mg a day for 7 days

## 2020-03-09 NOTE — Telephone Encounter (Signed)
Return call onPatricia Lloyd 2056-03-15 kristen [phone] if Dr Robinette Haines antibiotic please call it in to [facility].[name]

## 2020-07-20 ENCOUNTER — Telehealth: Payer: Self-pay | Admitting: Pulmonary Disease

## 2020-07-20 NOTE — Telephone Encounter (Signed)
Spoke with Hailey. She stated that she was calling to see who the attending provider would be for the patient since Arlys John left. I advised her that I would ask Dr. Isaiah Serge if he would be willing to be the attending provider. She verbalized understanding. She stated that we can call her directly at [phone] ext: [phone]. If we do not reach her, we can leave a detailed message.   Dr. Isaiah Serge, please advise if you would be willing to the attending physician for [facility]. Thanks!

## 2020-07-20 NOTE — Telephone Encounter (Signed)
Called Hailey but she did not answer. Left a detailed message.   Nothing further needed at time of call.

## 2020-07-20 NOTE — Telephone Encounter (Signed)
Yes. I can be the attending for the patient.

## 2020-08-16 ENCOUNTER — Telehealth: Payer: Self-pay | Admitting: Pulmonary Disease

## 2020-08-16 NOTE — Telephone Encounter (Signed)
Spoke with the Amber Lloyd  She states that Hospice MD (Dr Gibson Ramp) started her on maintenance pred 10 mg qd about 2 months ago  She needed refill and this was sent by Dr Gibson Ramp I updated MAR and will forward to Dr Isaiah Serge to make him aware

## 2020-08-27 ENCOUNTER — Other Ambulatory Visit: Payer: Self-pay | Admitting: Pulmonary Disease

## 2020-09-02 ENCOUNTER — Telehealth: Payer: Self-pay | Admitting: Pulmonary Disease

## 2020-09-02 MED ORDER — AZITHROMYCIN 250 MG PO TABS: ORAL_TABLET | ORAL | 0 refills | Status: AC

## 2020-09-02 NOTE — Telephone Encounter (Signed)
Called and spoke with pt's daughter Herbert Seta and stated to her that we were going to send zpak to pharmacy for pt and she verbalized understanding. Verified preferred pharmacy and sent Rx in for pt. Nothing further needed.

## 2020-09-02 NOTE — Telephone Encounter (Signed)
Primary Pulmonologist:  [facility] Last office visit and with whom: 09/01/2019  Elisha Headland NP What do we see them for (pulmonary problems): IPF, chronic respiratory failure Last OV assessment/plan:  Assessment and Plan:  Chronic respiratory failure with hypoxia (HCC) Doing better since being [facility] Patient maintained on 6 L At rest oxygen levels are controlled above 90% With physical exertion on 6 L oxygen levels are still dropping  Plan: Referral to hospice therapy today Continue to maintain oxygen saturations above 88%   IPF (idiopathic pulmonary fibrosis) (HCC) Plan: Continue Ofev at this time Referral to hospice We will discuss case with Dr. Isaiah Serge to see if he is agreeable with stopping antifibrotic's as this would be a requirement with hospice Continue oxygen therapies   Complex care coordination Plan: Patient agreeable to hospice referral, referral placed today We will discuss case with Dr. Joycelyn Das hospice team to notify of referral being placed   Follow Up Instructions:  Return in about 2 months (around 11/01/2019), or if symptoms worsen or fail to improve, for Follow up with Dr. Isaiah Serge.  I discussed the assessment and treatment plan with the patient. The patient was provided an opportunity to ask questions and all were answered. The patient agreed with the plan and demonstrated an understanding of the instructions.  The patient was advised to call back or seek an in-person evaluation if the symptoms worsen or if the condition fails to improve as anticipated.  I provided 24 minutes of non-face-to-face time during this encounter.   Coral Ceo, NP        Patient Instructions by Coral Ceo, NP at 09/01/2019 3:30 PM  Author: Coral Ceo, NP Author Type: Nurse Practitioner Filed: 09/01/2019 3:01 PM  Note Status: Addendum Cosign: Cosign Not Required Encounter Date: 09/01/2019  Editor: Coral Ceo, NP (Nurse  Practitioner)      Prior Versions: 1. Coral Ceo, NP (Nurse Practitioner) at 09/01/2019 2:54 PM - Signed    You were seen today by Coral Ceo, NP  for:   I am concerned regarding your high oxygen needs.  You continue to have low oxygen levels despite 6 L continuous.  With physical exertion.  We will refer you to hospice today as previously discussed.  As we can respect that you do not want to present to the emergency room or hospital again for further evaluation of your low oxygen needs.  I have contacted hospice there should be no out-of-pocket cost for you to be established with them.  I will discuss this with Dr. Isaiah Serge.  Elisha Headland, FNP  1. IPF (idiopathic pulmonary fibrosis) (HCC)  - Ambulatory referral to Hospice  2. Chronic respiratory failure with hypoxia (HCC)  Continue oxygen therapy as prescribed  >>>maintain oxygen saturations greater than 88 percent  >>>if unable to maintain oxygen saturations please contact the office  >>>do not smoke with oxygen  >>>can use nasal saline gel or nasal saline rinses to moisturize nose if oxygen causes dryness  3. Complex care coordination  Referral to hospice    We recommend today:       Orders Placed This Encounter  Procedures  . Ambulatory referral to Hospice    Referral Priority:   Routine    Referral Type:   Consultation    Referral Reason:   Specialty Services Required    Requested Specialty:   Hospice Services    Number of Visits Requested:   1  Orders Placed This Encounter  Procedures  . Ambulatory referral to Hospice   No orders of the defined types were placed in this encounter.   Follow Up:    No follow-ups on file.   Please do your part to reduce the spread of COVID-19:      Reduce your risk of any infection  and COVID19 by using the similar precautions used for avoiding the common cold or flu:  Wash your hands often with soap and warm water for at least 20  seconds.  If soap and water are not readily available, use an alcohol-based hand sanitizer with at least 60% alcohol.   If coughing or sneezing, cover your mouth and nose by coughing or sneezing into the elbow areas of your shirt or coat, into a tissue or into your sleeve (not your hands).  WEAR A MASK when in public   Avoid shaking hands with others and consider head nods or verbal greetings only.  Avoid touching your eyes,nose, or mouth with unwashed hands.  Avoid close contact with people who are sick.  Avoid places or events with large numbers of people in one location, like concerts or sporting events.  If you have some symptoms but not all symptoms, continue to monitor at home and seek medical attention if your symptoms worsen.  If you are having a medical emergency, call 911.   ADDITIONAL HEALTHCARE OPTIONS FOR PATIENTS  [facility] / e-Visit: [email or web address]         [facility]: [phone]  [facility]: [phone]                   [facility]: [phone]     It is flu season:   >>> Best ways to protect herself from the flu: Receive the yearly flu vaccine, practice good hand hygiene washing with soap and also using hand sanitizer when available, eat a nutritious meals, get adequate rest, hydrate appropriately   Please contact the office if your symptoms worsen or you have concerns that you are not improving.   Thank you for choosing [facility] for your healthcare, and for allowing [place] to partner with you on your healthcare journey. I am thankful to be able to provide care to you today.   Elisha Headland FNP-C         Assessment & Plan Note by Coral Ceo, NP at 09/01/2019 2:53 PM  Author: Coral Ceo, NP Author Type: Nurse Practitioner Filed: 09/01/2019 2:54 PM  Note Status: Written Cosign: Cosign Not Required Encounter Date: 09/01/2019   Problem: Complex care coordination  Editor: Coral Ceo, NP (Nurse Practitioner)               Plan: Patient agreeable to hospice referral, referral placed today We will discuss case with Dr. Joycelyn Das hospice team to notify of referral being placed       Assessment & Plan Note by Coral Ceo, NP at 09/01/2019 2:52 PM  Author: Coral Ceo, NP Author Type: Nurse Practitioner Filed: 09/01/2019 2:53 PM  Note Status: Written Cosign: Cosign Not Required Encounter Date: 09/01/2019  Problem: IPF (idiopathic pulmonary fibrosis) (HCC)  Editor: Coral Ceo, NP (Nurse Practitioner)               Plan: Matthias Hughs at this time Referral to hospice We will discuss case with Dr. Isaiah Serge to see if he is agreeable with stopping antifibrotic's as this would be  a requirement with hospice Continue oxygen therapies        Assessment & Plan Note by Coral Ceo, NP at 09/01/2019 2:52 PM  Author: Coral Ceo, NP Author Type: Nurse Practitioner Filed: 09/01/2019 2:52 PM  Note Status: Written Cosign: Cosign Not Required Encounter Date: 09/01/2019  Problem: Chronic respiratory failure with hypoxia [facility])  Editor: Coral Ceo, NP (Nurse Practitioner)               Doing better since being [facility] Patient maintained on 6 L At rest oxygen levels are controlled above 90% With physical exertion on 6 L oxygen levels are still dropping  Plan: Referral to hospice therapy today Continue to maintain oxygen saturations above 88%         Instructions     Return in about 2 months (around 11/01/2019), or if symptoms worsen or fail to improve, for Follow up with Dr. Isaiah Serge.  You were seen today by Coral Ceo, NP  for:   I am concerned regarding your high oxygen needs.  You continue to have low oxygen levels despite 6 L continuous.  With physical exertion.  We will refer you to hospice today as previously discussed.  As we can respect that you do not want to  present to the emergency room or hospital again for further evaluation of your low oxygen needs.  I have contacted hospice there should be no out-of-pocket cost for you to be established with them.  I will discuss this with Dr. Isaiah Serge.  Elisha Headland, FNP  1. IPF (idiopathic pulmonary fibrosis) (HCC)  - Ambulatory referral to Hospice  2. Chronic respiratory failure with hypoxia (HCC)  Continue oxygen therapy as prescribed  >>>maintain oxygen saturations greater than 88 percent  >>>if unable to maintain oxygen saturations please contact the office  >>>do not smoke with oxygen  >>>can use nasal saline gel or nasal saline rinses to moisturize nose if oxygen causes dryness  3. Complex care coordination  Referral to hospice    We recommend today:       Orders Placed This Encounter  Procedures  . Ambulatory referral to Hospice    Referral Priority:   Routine    Referral Type:   Consultation    Referral Reason:   Specialty Services Required    Requested Specialty:   Hospice Services    Number of Visits Requested:   1      Orders Placed This Encounter  Procedures  . Ambulatory referral to Hospice   No orders of the defined types were placed in this encounter.   Follow Up:    No follow-ups on file.   Please do your part to reduce the spread of COVID-19:      Reduce your risk of any infection  and COVID19 by using the similar precautions used for avoiding the common cold or flu:  Wash your hands often with soap and warm water for at least 20 seconds.  If soap and water are not readily available, use an alcohol-based hand sanitizer with at least 60% alcohol.   If coughing or sneezing, cover your mouth and nose by coughing or sneezing into the elbow areas of your shirt or coat, into a tissue or into your sleeve (not your hands).  WEAR A MASK when in public   Avoid shaking hands with others and consider head nods or verbal greetings  only.  Avoid touching your eyes,nose, or mouth with unwashed hands.  Avoid  close contact with people who are sick.  Avoid places or events with large numbers of people in one location, like concerts or sporting events.  If you have some symptoms but not all symptoms, continue to monitor at home and seek medical attention if your symptoms worsen.  If you are having a medical emergency, call 911.        Reason for call: Patient is on hospice care, has not left her home in 2 years per her daughter, Herbert Seta.  Spoke with patient long enough to get permission to speak with her daughter.  Daughter is currently not on the DPR, will mail the DPR form to the daughter/patient to complete and send back.  Daughter states that her mom has been having a productive cough with brown mucous, more severe pain than usual, fatigue and drop in sats into the 60% on 8L of oxygen just standing up and going to the bathroom.  She was just recently on an antibiotic for the same thing recently and perked up with the antibiotic.  Daughter is requesting an antibiotic.  Daughter states she is just trying to buy her mom some more time and increase her quality of life.  She is unable to tolerate the albuterol nebulizer 4 times daily, she is exhausted after just one treatment.  She is not using the Stiolto.  She has a hospice nurse visiting 2 x per week, but will be going out of town and suggested that she call our office.  Dr. Isaiah Serge, please advise.  Thank you.  (examples of things to ask: : When did symptoms start? Fever? Cough? Productive? Color to sputum? More sputum than usual? Wheezing? Have you needed increased oxygen? Are you taking your respiratory medications? What over the counter measures have you tried?)  No Known Allergies  Immunization History  Administered Date(s) Administered  . Influenza,inj,Quad PF,6+ Mos 02/28/2017, 02/13/2018  . Pneumococcal Polysaccharide-23 11/18/2017

## 2020-09-02 NOTE — Telephone Encounter (Signed)
Please send a prescription for Z-Pak

## 2020-09-02 NOTE — Telephone Encounter (Signed)
LMTCB on both numbers provided  Amber Lloyd is not on DPR Need to speak with the pt

## 2020-10-04 ENCOUNTER — Telehealth: Payer: Self-pay | Admitting: Pulmonary Disease

## 2020-10-04 NOTE — Telephone Encounter (Signed)
Spoke with Cordelia Pen with [facility] who states pt has gained 9lbs over the last 2 months and is de-sating to the 50's when transferring to [facility]. Pt does recover to 93% with application of 9L of O2. Cordelia Pen states this is not a new reaction and just wants Dr. Isaiah Serge to be aware. Routing Dr. Isaiah Serge as [name]. Nothing further needed at this time.

## 2020-11-22 ENCOUNTER — Other Ambulatory Visit: Payer: Self-pay | Admitting: Pulmonary Disease

## 2020-12-20 ENCOUNTER — Telehealth: Payer: Self-pay | Admitting: Pulmonary Disease

## 2020-12-20 NOTE — Telephone Encounter (Signed)
Tried Medical laboratory scientific officer, pt's hospice RN, had to [facility]   In the meanttime will ask Dr Isaiah Serge if he is okay with refills on zoloft, ibuprofen and omeprazole  She is a hospice pt  Thanks

## 2020-12-21 MED ORDER — SERTRALINE HCL 50 MG PO TABS: 100.0000 mg | ORAL_TABLET | Freq: Every day | ORAL | 1 refills | Status: AC

## 2020-12-21 MED ORDER — OMEPRAZOLE 40 MG PO CPDR: 40.0000 mg | DELAYED_RELEASE_CAPSULE | Freq: Every day | ORAL | 5 refills | Status: AC

## 2020-12-21 NOTE — Telephone Encounter (Signed)
Left message for RN Renee that [facility] would fill zoloft, ibuprofen and omeprazole.

## 2020-12-21 NOTE — Telephone Encounter (Signed)
Ok to refill these meds 

## 2021-02-08 ENCOUNTER — Telehealth: Payer: Self-pay | Admitting: Pulmonary Disease

## 2021-02-08 NOTE — Telephone Encounter (Signed)
Received a call from Haywood Filler at [facility] in [place], [place]  ph# [phone]. Patient passed away at home on 02-10-23 while under the care of [facility].  [facility] will not sign the death certificate and neither will the patient's PCP, B. Mann, PA (she has not been seen by him in over 3 years).  Dr. Isaiah Serge signed a prescription for the patient in July 2022 based on a request from the [facility] nurse.  I told the fMr. Cook to send the death certificate to Dr. Isaiah Serge and I will ask him to sign it.  He may not since she has not been physically seen in this office since April 2021.

## 2021-02-25 DEATH — deceased
